# Patient Record
Sex: Female | Born: 1960 | Race: White | Hispanic: No | Marital: Married | Smoking: Former smoker
Health system: Southern US, Community
[De-identification: ages and names within clinical notes are randomized; demographics above are authoritative.]

## PROBLEM LIST (undated history)

## (undated) DIAGNOSIS — M1990 Unspecified osteoarthritis, unspecified site: Secondary | ICD-10-CM

## (undated) DIAGNOSIS — Z889 Allergy status to unspecified drugs, medicaments and biological substances status: Secondary | ICD-10-CM

## (undated) DIAGNOSIS — K219 Gastro-esophageal reflux disease without esophagitis: Secondary | ICD-10-CM

## (undated) DIAGNOSIS — M5136 Other intervertebral disc degeneration, lumbar region: Secondary | ICD-10-CM

## (undated) DIAGNOSIS — M51369 Other intervertebral disc degeneration, lumbar region without mention of lumbar back pain or lower extremity pain: Secondary | ICD-10-CM

## (undated) DIAGNOSIS — T7840XA Allergy, unspecified, initial encounter: Secondary | ICD-10-CM

## (undated) DIAGNOSIS — J45909 Unspecified asthma, uncomplicated: Secondary | ICD-10-CM

## (undated) DIAGNOSIS — E119 Type 2 diabetes mellitus without complications: Secondary | ICD-10-CM

## (undated) DIAGNOSIS — IMO0002 Reserved for concepts with insufficient information to code with codable children: Secondary | ICD-10-CM

## (undated) DIAGNOSIS — K275 Chronic or unspecified peptic ulcer, site unspecified, with perforation: Secondary | ICD-10-CM

## (undated) HISTORY — DX: Chronic or unspecified peptic ulcer, site unspecified, with perforation: K27.5

## (undated) HISTORY — PX: BUNIONECTOMY: SHX129

## (undated) HISTORY — DX: Type 2 diabetes mellitus without complications: E11.9

## (undated) HISTORY — PX: OTHER SURGICAL HISTORY: SHX169

## (undated) HISTORY — DX: Unspecified asthma, uncomplicated: J45.909

## (undated) HISTORY — PX: COLONOSCOPY: SHX174

## (undated) HISTORY — DX: Allergy, unspecified, initial encounter: T78.40XA

## (undated) HISTORY — DX: Gastro-esophageal reflux disease without esophagitis: K21.9

---

## 1995-11-11 HISTORY — PX: REPAIR OF PERFORATED ULCER: SHX6065

## 2014-05-11 ENCOUNTER — Emergency Department (HOSPITAL_COMMUNITY): Payer: BC Managed Care – PPO

## 2014-05-11 ENCOUNTER — Encounter (HOSPITAL_COMMUNITY): Payer: Self-pay | Admitting: Emergency Medicine

## 2014-05-11 ENCOUNTER — Emergency Department (HOSPITAL_COMMUNITY)
Admission: EM | Admit: 2014-05-11 | Discharge: 2014-05-11 | Disposition: A | Discharge status: Home / Self Care | Payer: BC Managed Care – PPO | Attending: Emergency Medicine | Admitting: Emergency Medicine

## 2014-05-11 DIAGNOSIS — K279 Peptic ulcer, site unspecified, unspecified as acute or chronic, without hemorrhage or perforation: Secondary | ICD-10-CM

## 2014-05-11 HISTORY — DX: Reserved for concepts with insufficient information to code with codable children: IMO0002

## 2014-05-11 LAB — CBC WITH DIFFERENTIAL/PLATELET
Basophils Absolute: 0 10*3/uL (ref 0.0–0.1)
Basophils Relative: 0 % (ref 0–1)
Eosinophils Absolute: 0.2 10*3/uL (ref 0.0–0.7)
Eosinophils Relative: 2 % (ref 0–5)
HCT: 42.3 % (ref 36.0–46.0)
Hemoglobin: 15 g/dL (ref 12.0–15.0)
Lymphocytes Relative: 38 % (ref 12–46)
Lymphs Abs: 4.7 10*3/uL — ABNORMAL HIGH (ref 0.7–4.0)
MCH: 29.8 pg (ref 26.0–34.0)
MCHC: 35.5 g/dL (ref 30.0–36.0)
MCV: 84.1 fL (ref 78.0–100.0)
Monocytes Absolute: 1 10*3/uL (ref 0.1–1.0)
Monocytes Relative: 8 % (ref 3–12)
Neutro Abs: 6.5 10*3/uL (ref 1.7–7.7)
Neutrophils Relative %: 52 % (ref 43–77)
Platelets: 330 10*3/uL (ref 150–400)
RBC: 5.03 MIL/uL (ref 3.87–5.11)
RDW: 12.3 % (ref 11.5–15.5)
WBC: 12.4 10*3/uL — ABNORMAL HIGH (ref 4.0–10.5)

## 2014-05-11 LAB — URINALYSIS, ROUTINE W REFLEX MICROSCOPIC
Glucose, UA: NEGATIVE mg/dL
Hgb urine dipstick: NEGATIVE
Ketones, ur: NEGATIVE mg/dL
Nitrite: NEGATIVE
Protein, ur: 30 mg/dL — AB
Specific Gravity, Urine: 1.027 (ref 1.005–1.030)
Urobilinogen, UA: 0.2 mg/dL (ref 0.0–1.0)
pH: 5.5 (ref 5.0–8.0)

## 2014-05-11 LAB — URINE MICROSCOPIC-ADD ON

## 2014-05-11 LAB — I-STAT TROPONIN, ED: Troponin i, poc: 0 ng/mL (ref 0.00–0.08)

## 2014-05-11 LAB — COMPREHENSIVE METABOLIC PANEL
ALT: 16 U/L (ref 0–35)
AST: 26 U/L (ref 0–37)
Albumin: 4.3 g/dL (ref 3.5–5.2)
Alkaline Phosphatase: 85 U/L (ref 39–117)
Anion gap: 16 — ABNORMAL HIGH (ref 5–15)
BUN: 9 mg/dL (ref 6–23)
CO2: 23 mEq/L (ref 19–32)
Calcium: 9.6 mg/dL (ref 8.4–10.5)
Chloride: 100 mEq/L (ref 96–112)
Creatinine, Ser: 0.83 mg/dL (ref 0.50–1.10)
GFR calc Af Amer: >90 mL/min (ref >90)
GFR calc non Af Amer: 79 mL/min — ABNORMAL LOW (ref >90)
Glucose, Bld: 121 mg/dL — ABNORMAL HIGH (ref 70–99)
Potassium: 3.6 mEq/L — ABNORMAL LOW (ref 3.7–5.3)
Sodium: 139 mEq/L (ref 137–147)
Total Bilirubin: 1.2 mg/dL (ref 0.3–1.2)
Total Protein: 8 g/dL (ref 6.0–8.3)

## 2014-05-11 LAB — LIPASE, BLOOD: Lipase: 14 U/L (ref 11–59)

## 2014-05-11 MED ORDER — ONDANSETRON HCL 4 MG/2ML IJ SOLN
4.0000 mg | Freq: Once | INTRAMUSCULAR | Status: AC
  Administered 2014-05-11: 4 mg via INTRAVENOUS
  Filled 2014-05-11: qty 2

## 2014-05-11 MED ORDER — OMEPRAZOLE 20 MG PO CPDR: 20.0000 mg | DELAYED_RELEASE_CAPSULE | Freq: Two times a day (BID) | ORAL | Status: DC

## 2014-05-11 MED ORDER — GI COCKTAIL ~~LOC~~
30.0000 mL | Freq: Once | ORAL | Status: AC
  Administered 2014-05-11: 30 mL via ORAL
  Filled 2014-05-11: qty 30

## 2014-05-11 MED ORDER — FAMOTIDINE IN NACL 20-0.9 MG/50ML-% IV SOLN
20.0000 mg | Freq: Once | INTRAVENOUS | Status: AC
  Administered 2014-05-11: 20 mg via INTRAVENOUS
  Filled 2014-05-11: qty 50

## 2014-05-11 MED ORDER — MORPHINE SULFATE 4 MG/ML IJ SOLN
4.0000 mg | Freq: Once | INTRAMUSCULAR | Status: AC
  Administered 2014-05-11: 4 mg via INTRAVENOUS
  Filled 2014-05-11: qty 1

## 2014-05-11 MED ORDER — SODIUM CHLORIDE 0.9 % IV BOLUS (SEPSIS)
1000.0000 mL | INTRAVENOUS | Status: AC
  Administered 2014-05-11: 1000 mL via INTRAVENOUS

## 2014-05-11 MED ORDER — IOHEXOL 300 MG/ML  SOLN
100.0000 mL | Freq: Once | INTRAMUSCULAR | Status: AC | PRN
  Administered 2014-05-11: 100 mL via INTRAVENOUS

## 2014-05-11 MED ORDER — IOHEXOL 300 MG/ML  SOLN
50.0000 mL | Freq: Once | INTRAMUSCULAR | Status: AC | PRN
  Administered 2014-05-11: 50 mL via ORAL

## 2014-05-11 NOTE — ED Notes (Signed)
PA-C Browning at bedside

## 2014-05-11 NOTE — ED Notes (Signed)
Pt aware urine sample is needed 

## 2014-05-11 NOTE — ED Notes (Signed)
Patient is alert and oriented x3.  She was given DC instructions and follow up visit instructions.  Patient gave verbal understanding. She was DC ambulatory under her own power to home.  V/S stable.  He was not showing any signs of distress on DC 

## 2014-05-11 NOTE — Discharge Instructions (Signed)
Peptic Ulcer  A peptic ulcer is a sore in the lining of in your esophagus (esophageal ulcer), stomach (gastric ulcer), or in the first part of your small intestine (duodenal ulcer). The ulcer causes erosion into the deeper tissue.  CAUSES   Normally, the lining of the stomach and the small intestine protects itself from the acid that digests food. The protective lining can be damaged by:  · An infection caused by a bacterium called Helicobacter pylori (H. pylori).  · Regular use of nonsteroidal anti-inflammatory drugs (NSAIDs), such as ibuprofen or aspirin.  · Smoking tobacco.  Other risk factors include being older than 50, drinking alcohol excessively, and having a family history of ulcer disease.   SYMPTOMS   · Burning pain or gnawing in the area between the chest and the belly button.  · Heartburn.  · Nausea and vomiting.  · Bloating.  The pain can be worse on an empty stomach and at night. If the ulcer results in bleeding, it can cause:  · Black, tarry stools.  · Vomiting of bright red blood.  · Vomiting of coffee ground looking materials.  DIAGNOSIS   A diagnosis is usually made based upon your history and an exam. Other tests and procedures may be performed to find the cause of the ulcer. Finding a cause will help determine the best treatment. Tests and procedures may include:  · Blood tests, stool tests, or breath tests to check for the bacterium H. pylori.  · An upper gastrointestinal (GI) series of the esophagus, stomach, and small intestine.  · An endoscopy to examine the esophagus, stomach, and small intestine.  · A biopsy.  TREATMENT   Treatment may include:  · Eliminating the cause of the ulcer, such as smoking, NSAIDs, or alcohol.  · Medicines to reduce the amount of acid in your digestive tract.  · Antibiotic medicines if the ulcer is caused by the H. pylori bacterium.  · An upper endoscopy to treat a bleeding ulcer.  · Surgery if the bleeding is severe or if the ulcer created a hole somewhere in the  digestive system.  HOME CARE INSTRUCTIONS   · Avoid tobacco, alcohol, and caffeine. Smoking can increase the acid in the stomach, and continued smoking will impair the healing of ulcers.  · Avoid foods and drinks that seem to cause discomfort or aggravate your ulcer.  · Only take medicines as directed by your caregiver. Do not substitute over-the-counter medicines for prescription medicines without talking to your caregiver.  · Keep any follow-up appointments and tests as directed.  SEEK MEDICAL CARE IF:   · Your do not improve within 7 days of starting treatment.  · You have ongoing indigestion or heartburn.  SEEK IMMEDIATE MEDICAL CARE IF:   · You have sudden, sharp, or persistent abdominal pain.  · You have bloody or dark black, tarry stools.  · You vomit blood or vomit that looks like coffee grounds.  · You become light headed, weak, or feel faint.  · You become sweaty or clammy.  MAKE SURE YOU:   · Understand these instructions.  · Will watch your condition.  · Will get help right away if you are not doing well or get worse.  Document Released: 10/24/2000 Document Revised: 07/21/2012 Document Reviewed: 05/26/2012  Exit[employer]® Patient Information ©2015 [employer]. This information is not intended to replace advice given to you by your health care provider. Make sure you discuss any questions you have with your health care provider.

## 2014-05-11 NOTE — ED Provider Notes (Signed)
CSN: [id number]     Arrival date & time 05/11/14  1637 History   First MD Initiated Contact with Patient 05/11/14 1658     Chief Complaint  Patient presents with  . Chest Pain  . Abdominal Pain     (Consider location/radiation/quality/duration/timing/severity/associated sxs/prior Treatment) HPI Comments: Patient presents to the emergency department with chief complaint of abdominal pain times one week. She states the pain is worsened after eating. She reports having persistent vomiting anytime she tries to eat something. She states that she has had a very small amount of watery diarrhea, but has not been able to pass gas. She has had emergent surgery on her abdomen in the past for GI bleed/ulcer. Additionally, she complains of intermittent chest pain which she associates with eating. She states that her pain as 5/10. It radiates to the left side. She denies fevers, chills, or new dysuria. She is postmenopausal.  The history is provided by the patient. No language interpreter was used.    Past Medical History  Diagnosis Date  . Ulcer     perforated   Past Surgical History  Procedure Laterality Date  . Bunionectomy    . Repair of perforated ulcer     No family history on file. History  Substance Use Topics  . Smoking status: Never Smoker   . Smokeless tobacco: Not on file  . Alcohol Use: No   OB History   Grav Para Term Preterm Abortions TAB SAB Ect Mult Living                 Review of Systems  All other systems reviewed and are negative.     Allergies  Review of patient's allergies indicates not on file.  Home Medications   Prior to Admission medications   Not on File   BP 145/75  Pulse 88  Temp(Src) 98.6 F (37 C) (Oral)  Resp 16  SpO2 97% Physical Exam  Nursing note and vitals reviewed. Constitutional: She is oriented to person, place, and time. She appears well-developed and well-nourished.  HENT:  Head: Normocephalic and atraumatic.  Eyes:  Conjunctivae and EOM are normal. Pupils are equal, round, and reactive to light.  Neck: Normal range of motion. Neck supple.  Cardiovascular: Normal rate and regular rhythm.  Exam reveals no gallop and no friction rub.   No murmur heard. Pulmonary/Chest: Effort normal and breath sounds normal. No respiratory distress. She has no wheezes. She has no rales. She exhibits no tenderness.  Abdominal: Soft. Bowel sounds are normal. She exhibits no distension and no mass. There is tenderness. There is no rebound and no guarding.  Tenderness to palpation in upper abdomen, more on the left side, no focal lower abdominal tenderness  Musculoskeletal: Normal range of motion. She exhibits no edema and no tenderness.  Neurological: She is alert and oriented to person, place, and time.  Skin: Skin is warm and dry.  Psychiatric: She has a normal mood and affect. Her behavior is normal. Judgment and thought content normal.    ED Course  Procedures (including critical care time) Results for orders placed during the hospital encounter of 05/11/14  CBC WITH DIFFERENTIAL      Result Value Ref Range   WBC 12.4 (*) 4.0 - 10.5 K/uL   RBC 5.03  3.87 - 5.11 MIL/uL   Hemoglobin 15.0  12.0 - 15.0 g/dL   HCT 42.3  36.0 - 46.0 %   MCV 84.1  78.0 - 100.0 fL   MCH 29.8  26.0 - 34.0 pg   MCHC 35.5  30.0 - 36.0 g/dL   RDW 12.3  11.5 - 15.5 %   Platelets 330  150 - 400 K/uL   Neutrophils Relative % 52  43 - 77 %   Neutro Abs 6.5  1.7 - 7.7 K/uL   Lymphocytes Relative 38  12 - 46 %   Lymphs Abs 4.7 (*) 0.7 - 4.0 K/uL   Monocytes Relative 8  3 - 12 %   Monocytes Absolute 1.0  0.1 - 1.0 K/uL   Eosinophils Relative 2  0 - 5 %   Eosinophils Absolute 0.2  0.0 - 0.7 K/uL   Basophils Relative 0  0 - 1 %   Basophils Absolute 0.0  0.0 - 0.1 K/uL  COMPREHENSIVE METABOLIC PANEL      Result Value Ref Range   Sodium 139  137 - 147 mEq/L   Potassium 3.6 (*) 3.7 - 5.3 mEq/L   Chloride 100  96 - 112 mEq/L   CO2 23  19 - 32  mEq/L   Glucose, Bld 121 (*) 70 - 99 mg/dL   BUN 9  6 - 23 mg/dL   Creatinine, Ser 0.83  0.50 - 1.10 mg/dL   Calcium 9.6  8.4 - 10.5 mg/dL   Total Protein 8.0  6.0 - 8.3 g/dL   Albumin 4.3  3.5 - 5.2 g/dL   AST 26  0 - 37 U/L   ALT 16  0 - 35 U/L   Alkaline Phosphatase 85  39 - 117 U/L   Total Bilirubin 1.2  0.3 - 1.2 mg/dL   GFR calc non Af Amer 79 (*) >90 mL/min   GFR calc Af Amer >90  >90 mL/min   Anion gap 16 (*) 5 - 15  LIPASE, BLOOD      Result Value Ref Range   Lipase 14  11 - 59 U/L  URINALYSIS, ROUTINE W REFLEX MICROSCOPIC      Result Value Ref Range   Color, Urine AMBER (*) YELLOW   APPearance CLOUDY (*) CLEAR   Specific Gravity, Urine 1.027  1.005 - 1.030   pH 5.5  5.0 - 8.0   Glucose, UA NEGATIVE  NEGATIVE mg/dL   Hgb urine dipstick NEGATIVE  NEGATIVE   Bilirubin Urine SMALL (*) NEGATIVE   Ketones, ur NEGATIVE  NEGATIVE mg/dL   Protein, ur 30 (*) NEGATIVE mg/dL   Urobilinogen, UA 0.2  0.0 - 1.0 mg/dL   Nitrite NEGATIVE  NEGATIVE   Leukocytes, UA SMALL (*) NEGATIVE  URINE MICROSCOPIC-ADD ON      Result Value Ref Range   Squamous Epithelial / LPF FEW (*) RARE   WBC, UA 3-6  <3 WBC/hpf   RBC / HPF 0-2  <3 RBC/hpf   Bacteria, UA FEW (*) RARE   Casts HYALINE CASTS (*) NEGATIVE   Urine-Other MUCOUS PRESENT    I-STAT TROPOININ, ED      Result Value Ref Range   Troponin i, poc 0.00  0.00 - 0.08 ng/mL   Comment 3            Ct Abdomen Pelvis W Contrast  05/11/2014   CLINICAL DATA:  Postprandial abdominal pain with vomiting and diarrhea. Symptoms for approximately 1 week.  EXAM: CT ABDOMEN AND PELVIS WITH CONTRAST  TECHNIQUE: Multidetector CT imaging of the abdomen and pelvis was performed using the standard protocol following bolus administration of intravenous contrast.  CONTRAST:  13mL OMNIPAQUE IOHEXOL 300 MG/ML SOLN, 56mL OMNIPAQUE  IOHEXOL 300 MG/ML SOLN  COMPARISON:  None.  FINDINGS: The lung bases are clear. There is no significant pleural or pericardial  effusion.  The liver and biliary system appear normal. There is a small calcified gallstone. No gallbladder wall thickening or surrounding inflammatory change is seen. The spleen, adrenal glands and kidneys appear normal.  The distal stomach and proximal duodenum demonstrate mild wall thickening and surrounding inflammatory change. There are adjacent prominent lymph nodes on axial images 30-32, likely reactive. There also mildly prominent lymph nodes within the porta hepatis and ileocolonic mesentery. The remainder of the small bowel, appendix and colon appear normal. No extraluminal air or fluid collection is identified. There is no evidence of bowel obstruction.  There is minimal aortic atherosclerosis without aneurysm. The uterus, ovaries and bladder appear normal.  There are no worrisome osseous findings.  IMPRESSION: 1. Distal gastric and proximal duodenal wall thickening and surrounding inflammatory change suspicious for peptic ulcer disease or duodenitis. If symptoms do not respond to appropriate conservative therapy, endoscopic correlation would be warranted. 2. Prominent lymph nodes in the upper abdomen, likely reactive. 3. Cholelithiasis without evidence of cholecystitis or biliary dilatation.   Electronically Signed   By: Camie Patience M.D.   On: 05/11/2014 19:30   Dg Chest Portable 1 View  05/11/2014   CLINICAL DATA:  Left-sided chest pain  EXAM: PORTABLE CHEST - 1 VIEW  COMPARISON:  None.  FINDINGS: Lungs are clear.  No pleural effusion or pneumothorax.  The heart is normal in size.  IMPRESSION: No evidence of acute cardiopulmonary disease.   Electronically Signed   By: Julian Hy M.D.   On: 05/11/2014 18:00     Imaging Review No results found.   EKG Interpretation   Date/Time:  Thursday May 11 2014 16:45:13 EDT Ventricular Rate:  89 PR Interval:  149 QRS Duration: 88 QT Interval:  360 QTC Calculation: 438 R Axis:   -6 Text Interpretation:  Sinus rhythm Baseline wander  Confirmed by Alvino Chapel   MD, Ovid Curd [phone]) on 05/11/2014 4:52:36 PM      MDM   Final diagnoses:  PUD (peptic ulcer disease)    Patient with abdominal pain, nausea, vomiting, and diarrhea times one week. She states that she is concerned about obstruction. She does have risk factors including prior abdominal surgery. Will check CT, check labs, treat pain, and will reevaluate.  8:35 PM Patient is feeling improved. Reassuring labs.  Normal trop and ekg.  HEART score is 1.  Low suspicion for PE. CT remarkable for PUD vs duodenitis.  Will treat with omeprazole and recommend GI follow-up.      Montine Circle, PA-C 05/11/14 2038

## 2014-05-11 NOTE — ED Notes (Signed)
Pt c/o upper abd pain then 4-6 hours after eating (even small amounts) pt vomits bile like liquid and then next day has diarrhea that looks like bile as well. Pt states after the vomiting she has left chest pain.  Pt states this has been going on for about a week now.

## 2014-05-11 NOTE — ED Notes (Addendum)
Pt reports abdominal and left sided chest pain 3-5 post injection of meal. Pt denies CP at present time.

## 2014-05-11 NOTE — ED Notes (Signed)
Patient to be DC home after IVPB Pepcid completed, see MAR

## 2014-05-11 NOTE — Progress Notes (Signed)
  CARE MANAGEMENT ED NOTE 05/11/2014  Patient:  Christina [name], Christina [name]   Account Number:  [id number]  Date Initiated:  05/11/2014  Documentation initiated by:  Livia Snellen  Subjective/Objective Assessment:   Patient presents to Ed with abdominal and left sided chest pain     Subjective/Objective Assessment Detail:     Action/Plan:   Action/Plan Detail:   Anticipated DC Date:       Status Recommendation to Physician:   Result of Recommendation:    Other ED [place]  Other  PCP issues    Choice offered to / List presented to:            Status of service:  Completed, signed off  ED Comments:   ED Comments Detail:  EDCM spoke to patient at bedside.  As per patient, her pcp is Waunita Schooner NP at [facility] in [place].  System updated.

## 2014-05-12 NOTE — ED Provider Notes (Signed)
Medical screening examination/treatment/procedure(s) were performed by non-physician practitioner and as supervising physician I was immediately available for consultation/collaboration.   EKG Interpretation   Date/Time:  Thursday May 11 2014 16:45:13 EDT Ventricular Rate:  89 PR Interval:  149 QRS Duration: 88 QT Interval:  360 QTC Calculation: 438 R Axis:   -6 Text Interpretation:  Sinus rhythm Baseline wander Confirmed by Alvino Chapel   MD, Naksh Radi [phone]) on 05/11/2014 4:52:36 PM       Jasper Riling. Alvino Chapel, MD 05/12/14 [phone]

## 2015-12-27 ENCOUNTER — Encounter: Payer: Self-pay | Admitting: Family Medicine

## 2015-12-27 DIAGNOSIS — T7840XA Allergy, unspecified, initial encounter: Secondary | ICD-10-CM | POA: Insufficient documentation

## 2015-12-27 DIAGNOSIS — J45909 Unspecified asthma, uncomplicated: Secondary | ICD-10-CM | POA: Insufficient documentation

## 2015-12-27 DIAGNOSIS — K275 Chronic or unspecified peptic ulcer, site unspecified, with perforation: Secondary | ICD-10-CM | POA: Insufficient documentation

## 2015-12-28 ENCOUNTER — Ambulatory Visit (INDEPENDENT_AMBULATORY_CARE_PROVIDER_SITE_OTHER): Payer: BLUE CROSS/BLUE SHIELD | Admitting: Family Medicine

## 2015-12-28 ENCOUNTER — Encounter: Payer: Self-pay | Admitting: Family Medicine

## 2015-12-28 VITALS — BP 132/88 | HR 82 | Temp 97.7°F | Resp 18 | Ht 65.5 in | Wt 233.0 lb

## 2015-12-28 DIAGNOSIS — Z7689 Persons encountering health services in other specified circumstances: Secondary | ICD-10-CM

## 2015-12-28 DIAGNOSIS — R739 Hyperglycemia, unspecified: Secondary | ICD-10-CM | POA: Diagnosis not present

## 2015-12-28 DIAGNOSIS — Z7189 Other specified counseling: Secondary | ICD-10-CM | POA: Diagnosis not present

## 2015-12-28 DIAGNOSIS — Z0000 Encounter for general adult medical examination without abnormal findings: Secondary | ICD-10-CM

## 2015-12-28 MED ORDER — METFORMIN HCL 500 MG PO TABS: 1000.0000 mg | ORAL_TABLET | Freq: Two times a day (BID) | ORAL | Status: DC

## 2015-12-28 NOTE — Progress Notes (Signed)
Subjective:    Patient ID: Christina [name], female    DOB: January 17, 1961, 55 y.o.   MRN: [id number]  HPI Patient is here today to establish care. She was also scheduled for a physical exam. However in the interval time she is become very concerned because it appears she is a diabetic. She recently had a colonoscopy at Dr. Lorie Apley office. Her random blood sugar was found to be 290. They repeated another random blood sugar and found to be 281. Both of which would be consistent with diabetes. The patient has been checking fasting blood sugars every morning and finding it ranging between 2020-221. Therefore I anticipateher hemoglobin A1c will be between 9.5 and 10.5. She is here today with numerous questions regarding this and how to manage her diabetes. Colonoscopy was normal. She is also recently had an eye exam and a mammogram which were normal (per her report). Past Medical History  Diagnosis Date  . Ulcer     perforated  . Allergy   . Asthma   . Perforated ulcer ([place])     due to ibuprofen  . GERD (gastroesophageal reflux disease)    Past Surgical History  Procedure Laterality Date  . Bunionectomy    . Repair of perforated ulcer     Current Outpatient Prescriptions on File Prior to Visit  Medication Sig Dispense Refill  . Beclomethasone Dipropionate (QNASL) 80 MCG/ACT AERS Place 1 spray into the nose daily.    . DiphenhydrAMINE HCl (BENADRYL PO) Take by mouth. 1 or 2 daily as needed    . omeprazole (PRILOSEC) 20 MG capsule Take 1 capsule (20 mg total) by mouth 2 (two) times daily before a meal. 60 capsule 0   No current facility-administered medications on file prior to visit.   Allergies  Allergen Reactions  . Influenza Vaccines Other (See Comments)    Seizures and shortness of breath  . Egg White [Albumen, Egg]     Sneezing   . Ibuprofen    Social History   Social History  . Marital Status: Married    Spouse Name: N/A  . Number of Children: N/A  . Years of Education: N/A     Occupational History  . Not on file.   Social History Main Topics  . Smoking status: Never Smoker   . Smokeless tobacco: Never Used  . Alcohol Use: No  . Drug Use: No  . Sexual Activity: Yes   Other Topics Concern  . Not on file   Social History Narrative   Family History  Problem Relation Age of Onset  . Kidney disease Father   . Diabetes Father   . Asthma Sister   . Diabetes Sister   . Diabetes Brother       Review of Systems  All other systems reviewed and are negative.      Objective:   Physical Exam  Constitutional: She appears well-developed and well-nourished. No distress.  Cardiovascular: Normal rate, regular rhythm, normal heart sounds and intact distal pulses.   No murmur heard. Pulmonary/Chest: Effort normal and breath sounds normal. No respiratory distress. She has no wheezes. She has no rales.  Abdominal: Soft. Bowel sounds are normal. She exhibits no distension. There is no tenderness. There is no rebound and no guarding.  Skin: She is not diaphoretic.  Vitals reviewed.         Assessment & Plan:  Adult general medical exam - Plan: CBC with Differential/Platelet, COMPLETE METABOLIC PANEL WITH GFR, Lipid panel  Establishing care with  new doctor, encounter for - Plan: VITAMIN D 25 Hydroxy (Vit-D Deficiency, Fractures)  Hyperglycemia - Plan: Hemoglobin A1c, Microalbumin, urine  I was unable to perform a complete physical exam as I spent more than 45 minutes answering the patient's questions. She will start metformin and gradually increased to 1000 mg by mouth twice a day. Also discussed at length a diabetic low carb diet trying to reduce her carb intake to less than 45 g per meal. Also recommended 30 minutes a day of aerobic exercise. I will check a CBC, CMP, fasting lipid panel, urine microalbumin, and a vitamin D level. I will have the patient check fasting blood sugars and two-hour postprandial sugars and then recheck here in one month. If  hemoglobin A1c is significantly elevated, we may also need to supplement the metformin with jardiance.

## 2015-12-29 LAB — COMPLETE METABOLIC PANEL WITH GFR
ALT: 20 U/L (ref 6–29)
AST: 21 U/L (ref 10–35)
Albumin: 4.5 g/dL (ref 3.6–5.1)
Alkaline Phosphatase: 80 U/L (ref 33–130)
BUN: 10 mg/dL (ref 7–25)
CO2: 24 mmol/L (ref 20–31)
Calcium: 9.3 mg/dL (ref 8.6–10.4)
Chloride: 105 mmol/L (ref 98–110)
Creat: 0.85 mg/dL (ref 0.50–1.05)
GFR, Est African American: 89 mL/min (ref >=60)
GFR, Est Non African American: 77 mL/min (ref >=60)
Glucose, Bld: 139 mg/dL — ABNORMAL HIGH (ref 70–99)
Potassium: 3.9 mmol/L (ref 3.5–5.3)
Sodium: 141 mmol/L (ref 135–146)
Total Bilirubin: 1 mg/dL (ref 0.2–1.2)
Total Protein: 7.1 g/dL (ref 6.1–8.1)

## 2015-12-29 LAB — CBC WITH DIFFERENTIAL/PLATELET
Basophils Absolute: 0 10*3/uL (ref 0.0–0.1)
Basophils Relative: 0 % (ref 0–1)
Eosinophils Absolute: 0.3 10*3/uL (ref 0.0–0.7)
Eosinophils Relative: 3 % (ref 0–5)
HCT: 42.1 % (ref 36.0–46.0)
Hemoglobin: 14.3 g/dL (ref 12.0–15.0)
Lymphocytes Relative: 45 % (ref 12–46)
Lymphs Abs: 4.1 10*3/uL — ABNORMAL HIGH (ref 0.7–4.0)
MCH: 29.5 pg (ref 26.0–34.0)
MCHC: 34 g/dL (ref 30.0–36.0)
MCV: 87 fL (ref 78.0–100.0)
MPV: 10.5 fL (ref 8.6–12.4)
Monocytes Absolute: 0.6 10*3/uL (ref 0.1–1.0)
Monocytes Relative: 7 % (ref 3–12)
Neutro Abs: 4.1 10*3/uL (ref 1.7–7.7)
Neutrophils Relative %: 45 % (ref 43–77)
Platelets: 282 10*3/uL (ref 150–400)
RBC: 4.84 MIL/uL (ref 3.87–5.11)
RDW: 13.5 % (ref 11.5–15.5)
WBC: 9.1 10*3/uL (ref 4.0–10.5)

## 2015-12-29 LAB — LIPID PANEL
Cholesterol: 128 mg/dL (ref 125–200)
HDL: 40 mg/dL — ABNORMAL LOW (ref >=46)
LDL Cholesterol: 58 mg/dL (ref <130)
Total CHOL/HDL Ratio: 3.2 Ratio (ref <=5.0)
Triglycerides: 149 mg/dL (ref <150)
VLDL: 30 mg/dL (ref <30)

## 2015-12-29 LAB — VITAMIN D 25 HYDROXY (VIT D DEFICIENCY, FRACTURES): Vit D, 25-Hydroxy: 13 ng/mL — ABNORMAL LOW (ref 30–100)

## 2015-12-29 LAB — MICROALBUMIN, URINE: Microalb, Ur: 4.2 mg/dL

## 2015-12-29 LAB — HEMOGLOBIN A1C
Hgb A1c MFr Bld: 9.2 % — ABNORMAL HIGH (ref <5.7)
Mean Plasma Glucose: 217 mg/dL — ABNORMAL HIGH (ref <117)

## 2016-02-01 ENCOUNTER — Encounter: Payer: Self-pay | Admitting: Family Medicine

## 2016-02-01 ENCOUNTER — Ambulatory Visit (INDEPENDENT_AMBULATORY_CARE_PROVIDER_SITE_OTHER): Payer: BLUE CROSS/BLUE SHIELD | Admitting: Family Medicine

## 2016-02-01 VITALS — BP 136/78 | HR 86 | Temp 98.0°F | Resp 18 | Ht 65.5 in | Wt 228.0 lb

## 2016-02-01 DIAGNOSIS — E1100 Type 2 diabetes mellitus with hyperosmolarity without nonketotic hyperglycemic-hyperosmolar coma (NKHHC): Secondary | ICD-10-CM | POA: Diagnosis not present

## 2016-02-01 DIAGNOSIS — R739 Hyperglycemia, unspecified: Secondary | ICD-10-CM | POA: Diagnosis not present

## 2016-02-01 NOTE — Progress Notes (Signed)
Subjective:    Patient ID: Christina [name], female    DOB: [birth year], 55 y.o.   MRN: [id number]  HPI 2/17 Patient is here today to establish care. She was also scheduled for a physical exam. However in the interval time she is become very concerned because it appears she is a diabetic. She recently had a colonoscopy at Dr. Lorie Apley office. Her random blood sugar was found to be 290. They repeated another random blood sugar and found to be 281. Both of which would be consistent with diabetes. The patient has been checking fasting blood sugars every morning and finding it ranging between 2020-221. Therefore I anticipateher hemoglobin A1c will be between 9.5 and 10.5. She is here today with numerous questions regarding this and how to manage her diabetes. Colonoscopy was normal. She is also recently had an eye exam and a mammogram which were normal (per her report).  At that time, my plan was: I was unable to perform a complete physical exam as I spent more than 45 minutes answering the patient's questions. She will start metformin and gradually increased to 1000 mg by mouth twice a day. Also discussed at length a diabetic low carb diet trying to reduce her carb intake to less than 45 g per meal. Also recommended 30 minutes a day of aerobic exercise. I will check a CBC, CMP, fasting lipid panel, urine microalbumin, and a vitamin D level. I will have the patient check fasting blood sugars and two-hour postprandial sugars and then recheck here in one month. If hemoglobin A1c is significantly elevated, we may also need to supplement the metformin with jardiance.  02/01/16 Patient is currently on metformin 1000 mg by mouth twice a day. Her fasting blood sugars are between 130 and 145. Her two-hour postprandial sugars are between 120 and 150. I am presently surprised. Her sugars are very consistent. She seems to be doing very well and denies any side effects on the metformin. Past Medical History  Diagnosis  Date  . Ulcer     perforated  . Allergy   . Asthma   . Perforated ulcer ([place])     due to ibuprofen  . GERD (gastroesophageal reflux disease)    Past Surgical History  Procedure Laterality Date  . Bunionectomy    . Repair of perforated ulcer     Current Outpatient Prescriptions on File Prior to Visit  Medication Sig Dispense Refill  . Beclomethasone Dipropionate (QNASL) 80 MCG/ACT AERS Place 1 spray into the nose daily.    . DiphenhydrAMINE HCl (BENADRYL PO) Take by mouth. 1 or 2 daily as needed    . omeprazole (PRILOSEC) 20 MG capsule Take 1 capsule (20 mg total) by mouth 2 (two) times daily before a meal. 60 capsule 0   No current facility-administered medications on file prior to visit.   Allergies  Allergen Reactions  . Influenza Vaccines Other (See Comments)    Seizures and shortness of breath  . Egg White [Albumen, Egg]     Sneezing   . Ibuprofen    Social History   Social History  . Marital Status: Married    Spouse Name: N/A  . Number of Children: N/A  . Years of Education: N/A   Occupational History  . Not on file.   Social History Main Topics  . Smoking status: Never Smoker   . Smokeless tobacco: Never Used  . Alcohol Use: No  . Drug Use: No  . Sexual Activity: Yes   Other Topics  Concern  . Not on file   Social History Narrative   Family History  Problem Relation Age of Onset  . Kidney disease Father   . Diabetes Father   . Asthma Sister   . Diabetes Sister   . Diabetes Brother       Review of Systems  All other systems reviewed and are negative.      Objective:   Physical Exam  Constitutional: She appears well-developed and well-nourished. No distress.  Cardiovascular: Normal rate, regular rhythm, normal heart sounds and intact distal pulses.   No murmur heard. Pulmonary/Chest: Effort normal and breath sounds normal. No respiratory distress. She has no wheezes. She has no rales.  Abdominal: Soft. Bowel sounds are normal. She  exhibits no distension. There is no tenderness. There is no rebound and no guarding.  Skin: She is not diaphoretic.  Vitals reviewed.         Assessment & Plan:  Hyperglycemia  Uncontrolled type 2 diabetes mellitus with hyperosmolarity without coma, without long-term current use of insulin (HCC) I recommended continuing metformin at the present time and rechecking hemoglobin A1c along with a fasting lipid panel in 3 months. At that time we will check a urine microalbumin and if elevated we'll add an ACE inhibitor. I recommended 10-15 pounds weight loss and 30 minutes of aerobic exercise 5 days a week. If hemoglobin A1c is not less than 6.5 at her next visit, I will add jardiance.

## 2016-02-11 ENCOUNTER — Telehealth: Payer: Self-pay | Admitting: Family Medicine

## 2016-02-11 NOTE — Telephone Encounter (Signed)
Patient forgot to ask when she was here last if you would please check her Vitamin B and vitamin D levels when she comes in for blood work in June.  CB# [phone]

## 2016-02-11 NOTE — Telephone Encounter (Signed)
ok 

## 2016-02-11 NOTE — Telephone Encounter (Signed)
OK to do and I will put in for future labs - vit d was just done 12/28/15 = abnormal and taking vit d 5000 qd

## 2016-02-13 ENCOUNTER — Other Ambulatory Visit: Payer: Self-pay | Admitting: Family Medicine

## 2016-02-13 DIAGNOSIS — E119 Type 2 diabetes mellitus without complications: Secondary | ICD-10-CM

## 2016-02-13 DIAGNOSIS — Z79899 Other long term (current) drug therapy: Secondary | ICD-10-CM

## 2016-02-13 DIAGNOSIS — R7989 Other specified abnormal findings of blood chemistry: Secondary | ICD-10-CM

## 2016-02-13 NOTE — Telephone Encounter (Signed)
Orders placed.

## 2016-04-22 ENCOUNTER — Other Ambulatory Visit: Payer: Self-pay | Admitting: Family Medicine

## 2016-04-22 MED ORDER — METFORMIN HCL 1000 MG PO TABS: 1000.0000 mg | ORAL_TABLET | Freq: Two times a day (BID) | ORAL | Status: DC

## 2016-04-22 NOTE — Telephone Encounter (Signed)
Medication refilled per protocol. 

## 2016-04-22 NOTE — Telephone Encounter (Signed)
Pt is requesting a refill of metformin. She is leaving to go out of town tomorrow.

## 2016-05-01 ENCOUNTER — Other Ambulatory Visit: Payer: Self-pay | Admitting: Gastroenterology

## 2016-05-01 DIAGNOSIS — R1013 Epigastric pain: Secondary | ICD-10-CM

## 2016-05-05 ENCOUNTER — Other Ambulatory Visit: Payer: BLUE CROSS/BLUE SHIELD

## 2016-05-06 ENCOUNTER — Ambulatory Visit: Payer: BLUE CROSS/BLUE SHIELD | Admitting: Family Medicine

## 2016-05-12 ENCOUNTER — Other Ambulatory Visit: Payer: BLUE CROSS/BLUE SHIELD

## 2016-05-12 ENCOUNTER — Ambulatory Visit: Payer: BLUE CROSS/BLUE SHIELD | Admitting: Family Medicine

## 2016-05-12 DIAGNOSIS — Z79899 Other long term (current) drug therapy: Secondary | ICD-10-CM

## 2016-05-12 DIAGNOSIS — E119 Type 2 diabetes mellitus without complications: Secondary | ICD-10-CM

## 2016-05-12 DIAGNOSIS — R7989 Other specified abnormal findings of blood chemistry: Secondary | ICD-10-CM

## 2016-05-12 LAB — HEMOGLOBIN A1C
Hgb A1c MFr Bld: 6.1 % — ABNORMAL HIGH (ref <5.7)
Mean Plasma Glucose: 128 mg/dL

## 2016-05-12 LAB — VITAMIN B12: Vitamin B-12: 459 pg/mL (ref 200–1100)

## 2016-05-12 LAB — COMPLETE METABOLIC PANEL WITH GFR
ALT: 22 U/L (ref 6–29)
AST: 20 U/L (ref 10–35)
Albumin: 4.5 g/dL (ref 3.6–5.1)
Alkaline Phosphatase: 53 U/L (ref 33–130)
BUN: 10 mg/dL (ref 7–25)
CO2: 25 mmol/L (ref 20–31)
Calcium: 9.2 mg/dL (ref 8.6–10.4)
Chloride: 106 mmol/L (ref 98–110)
Creat: 0.68 mg/dL (ref 0.50–1.05)
GFR, Est African American: >89 mL/min (ref >=60)
GFR, Est Non African American: >89 mL/min (ref >=60)
Glucose, Bld: 153 mg/dL — ABNORMAL HIGH (ref 70–99)
Potassium: 4.1 mmol/L (ref 3.5–5.3)
Sodium: 142 mmol/L (ref 135–146)
Total Bilirubin: 1.2 mg/dL (ref 0.2–1.2)
Total Protein: 6.9 g/dL (ref 6.1–8.1)

## 2016-05-13 LAB — VITAMIN D 25 HYDROXY (VIT D DEFICIENCY, FRACTURES): Vit D, 25-Hydroxy: 23 ng/mL — ABNORMAL LOW (ref 30–100)

## 2016-05-15 ENCOUNTER — Ambulatory Visit (INDEPENDENT_AMBULATORY_CARE_PROVIDER_SITE_OTHER): Payer: BLUE CROSS/BLUE SHIELD | Admitting: Family Medicine

## 2016-05-15 ENCOUNTER — Encounter: Payer: Self-pay | Admitting: Family Medicine

## 2016-05-15 VITALS — BP 130/76 | HR 78 | Temp 98.8°F | Resp 18 | Ht 65.5 in | Wt 226.0 lb

## 2016-05-15 DIAGNOSIS — E119 Type 2 diabetes mellitus without complications: Secondary | ICD-10-CM | POA: Diagnosis not present

## 2016-05-15 NOTE — Progress Notes (Signed)
Subjective:    Patient ID: Christina [name], female    DOB: 30-Jan-1961, 55 y.o.   MRN: [id number]  HPI 2/17 Patient is here today to establish care. She was also scheduled for a physical exam. However in the interval time she is become very concerned because it appears she is a diabetic. She recently had a colonoscopy at Dr. Lorie Apley office. Her random blood sugar was found to be 290. They repeated another random blood sugar and found to be 281. Both of which would be consistent with diabetes. The patient has been checking fasting blood sugars every morning and finding it ranging between 2020-221. Therefore I anticipateher hemoglobin A1c will be between 9.5 and 10.5. She is here today with numerous questions regarding this and how to manage her diabetes. Colonoscopy was normal. She is also recently had an eye exam and a mammogram which were normal (per her report).  At that time, my plan was: I was unable to perform a complete physical exam as I spent more than 45 minutes answering the patient's questions. She will start metformin and gradually increased to 1000 mg by mouth twice a day. Also discussed at length a diabetic low carb diet trying to reduce her carb intake to less than 45 g per meal. Also recommended 30 minutes a day of aerobic exercise. I will check a CBC, CMP, fasting lipid panel, urine microalbumin, and a vitamin D level. I will have the patient check fasting blood sugars and two-hour postprandial sugars and then recheck here in one month. If hemoglobin A1c is significantly elevated, we may also need to supplement the metformin with jardiance.  02/01/16 Patient is currently on metformin 1000 mg by mouth twice a day. Her fasting blood sugars are between 130 and 145. Her two-hour postprandial sugars are between 120 and 150. I am presently surprised. Her sugars are very consistent. She seems to be doing very well and denies any side effects on the metformin.  At that time, my plan was: I  recommended continuing metformin at the present time and rechecking hemoglobin A1c along with a fasting lipid panel in 3 months. At that time we will check a urine microalbumin and if elevated we'll add an ACE inhibitor. I recommended 10-15 pounds weight loss and 30 minutes of aerobic exercise 5 days a week. If hemoglobin A1c is not less than 6.5 at her next visit, I will add jardiance.  05/15/16 Here today for follow up.  Overall she is doing very well. She is tolerating metformin without difficulty. She denies any side effects from medication. Diabetic eye exam is up-to-date. She is not taking aspirin due to her history of a perforated ulcer. She denies any neuropathy. She denies any chest pain shortness of breath or dyspnea on exertion. She denies any hypoglycemia. Hemoglobin A1c has dropped dramatically. Unfortunately she is not exercising.  Most recent labs are listed below: Lab on 05/12/2016  Component Date Value Ref Range Status  . Hgb A1c MFr Bld 05/12/2016 6.1* <5.7 % Final   Comment:   For someone without known diabetes, a hemoglobin A1c value between 5.7% and 6.4% is consistent with prediabetes and should be confirmed with a follow-up test.   For someone with known diabetes, a value <7% indicates that their diabetes is well controlled. A1c targets should be individualized based on duration of diabetes, age, co-morbid conditions and other considerations.   This assay result is consistent with an increased risk of diabetes.   Currently, no consensus exists regarding  use of hemoglobin A1c for diagnosis of diabetes in children.     . Mean Plasma Glucose 05/12/2016 128   Final  . Vitamin B-12 05/12/2016 459  200 - 1100 pg/mL Final  . Vit D, 25-Hydroxy 05/12/2016 23* 30 - 100 ng/mL Final   Comment: Vitamin D Status           25-OH Vitamin D        Deficiency                <20 ng/mL        Insufficiency         20 - 29 ng/mL        Optimal             > or = 30 ng/mL   For 25-OH  Vitamin D testing on patients on D2-supplementation and patients for whom quantitation of D2 and D3 fractions is required, the QuestAssureD 25-OH VIT D, (D2,D3), LC/MS/MS is recommended: order code [id number] (patients > 2 yrs).   . Sodium 05/12/2016 142  135 - 146 mmol/L Final  . Potassium 05/12/2016 4.1  3.5 - 5.3 mmol/L Final  . Chloride 05/12/2016 106  98 - 110 mmol/L Final  . CO2 05/12/2016 25  20 - 31 mmol/L Final  . Glucose, Bld 05/12/2016 153* 70 - 99 mg/dL Final  . BUN 05/12/2016 10  7 - 25 mg/dL Final  . Creat 05/12/2016 0.68  0.50 - 1.05 mg/dL Final   Comment:   For patients > or = 56 years of age: The upper reference limit for Creatinine is approximately 13% higher for people identified as African-American.     . Total Bilirubin 05/12/2016 1.2  0.2 - 1.2 mg/dL Final  . Alkaline Phosphatase 05/12/2016 53  33 - 130 U/L Final  . AST 05/12/2016 20  10 - 35 U/L Final  . ALT 05/12/2016 22  6 - 29 U/L Final  . Total Protein 05/12/2016 6.9  6.1 - 8.1 g/dL Final  . Albumin 05/12/2016 4.5  3.6 - 5.1 g/dL Final  . Calcium 05/12/2016 9.2  8.6 - 10.4 mg/dL Final  . GFR, Est African American 05/12/2016 >89  >=60 mL/min Final  . GFR, Est Non African American 05/12/2016 >89  >=60 mL/min Final    Past Medical History  Diagnosis Date  . Ulcer     perforated  . Allergy   . Asthma   . Perforated ulcer ([place])     due to ibuprofen  . GERD (gastroesophageal reflux disease)    Past Surgical History  Procedure Laterality Date  . Bunionectomy    . Repair of perforated ulcer     Current Outpatient Prescriptions on File Prior to Visit  Medication Sig Dispense Refill  . Beclomethasone Dipropionate (QNASL) 80 MCG/ACT AERS Place 1 spray into the nose daily.    . DiphenhydrAMINE HCl (BENADRYL PO) Take by mouth. 1 or 2 daily as needed    . metFORMIN (GLUCOPHAGE) 1000 MG tablet Take 1 tablet (1,000 mg total) by mouth 2 (two) times daily with a meal. 60 tablet 2  . omeprazole (PRILOSEC) 20 MG  capsule Take 1 capsule (20 mg total) by mouth 2 (two) times daily before a meal. 60 capsule 0   No current facility-administered medications on file prior to visit.   Allergies  Allergen Reactions  . Influenza Vaccines Other (See Comments)    Seizures and shortness of breath  . Egg White [Albumen, Egg]     Sneezing   .  Ibuprofen    Social History   Social History  . Marital Status: Married    Spouse Name: N/A  . Number of Children: N/A  . Years of Education: N/A   Occupational History  . Not on file.   Social History Main Topics  . Smoking status: Never Smoker   . Smokeless tobacco: Never Used  . Alcohol Use: No  . Drug Use: No  . Sexual Activity: Yes   Other Topics Concern  . Not on file   Social History Narrative   Family History  Problem Relation Age of Onset  . Kidney disease Father   . Diabetes Father   . Asthma Sister   . Diabetes Sister   . Diabetes Brother       Review of Systems  All other systems reviewed and are negative.      Objective:   Physical Exam  Constitutional: She appears well-developed and well-nourished. No distress.  Cardiovascular: Normal rate, regular rhythm, normal heart sounds and intact distal pulses.   No murmur heard. Pulmonary/Chest: Effort normal and breath sounds normal. No respiratory distress. She has no wheezes. She has no rales.  Abdominal: Soft. Bowel sounds are normal. She exhibits no distension. There is no tenderness. There is no rebound and no guarding.  Skin: She is not diaphoretic.  Vitals reviewed.         Assessment & Plan:  Controlled type 2 diabetes mellitus without complication, without long-term current use of insulin (HCC) - Plan: Microalbumin, urine  Diabetes is now controlled. Her blood pressure is excellent. Her LDL cholesterol is excellent when checked last spring. I have recommended that she start aerobic exercise 30 minutes a day 5 days a week. Monitor for declining blood sugars as we may  need to decrease her metformin dose if she starts exercising and losing weight. I will check a urine microalbumin and if elevated, I will start the patient on an ACE inhibitor. The remainder of her preventative care is up-to-date.

## 2016-05-16 LAB — MICROALBUMIN, URINE: Microalb, Ur: 3.1 mg/dL

## 2016-05-19 ENCOUNTER — Other Ambulatory Visit: Payer: BLUE CROSS/BLUE SHIELD

## 2016-05-22 ENCOUNTER — Ambulatory Visit: Payer: BLUE CROSS/BLUE SHIELD | Admitting: Family Medicine

## 2016-06-05 ENCOUNTER — Encounter: Payer: Self-pay | Admitting: Family Medicine

## 2016-06-05 ENCOUNTER — Ambulatory Visit (INDEPENDENT_AMBULATORY_CARE_PROVIDER_SITE_OTHER): Payer: BLUE CROSS/BLUE SHIELD | Admitting: Family Medicine

## 2016-06-05 VITALS — BP 144/90 | HR 100 | Temp 98.1°F | Resp 18 | Ht 65.5 in | Wt 226.0 lb

## 2016-06-05 DIAGNOSIS — R0789 Other chest pain: Secondary | ICD-10-CM | POA: Diagnosis not present

## 2016-06-05 DIAGNOSIS — M5412 Radiculopathy, cervical region: Secondary | ICD-10-CM

## 2016-06-05 DIAGNOSIS — E119 Type 2 diabetes mellitus without complications: Secondary | ICD-10-CM | POA: Insufficient documentation

## 2016-06-05 MED ORDER — PREDNISONE 20 MG PO TABS: ORAL_TABLET | ORAL | 0 refills | Status: DC

## 2016-06-05 MED ORDER — OXYCODONE-ACETAMINOPHEN 5-325 MG PO TABS: 1.0000 | ORAL_TABLET | Freq: Three times a day (TID) | ORAL | 0 refills | Status: DC | PRN

## 2016-06-05 MED ORDER — CYCLOBENZAPRINE HCL 10 MG PO TABS: 10.0000 mg | ORAL_TABLET | Freq: Three times a day (TID) | ORAL | 0 refills | Status: DC | PRN

## 2016-06-05 MED ORDER — METHYLPREDNISOLONE ACETATE 40 MG/ML IJ SUSP
60.0000 mg | Freq: Once | INTRAMUSCULAR | Status: AC
  Administered 2016-06-05: 60 mg via INTRAMUSCULAR

## 2016-06-05 NOTE — Addendum Note (Signed)
Addended by: Shary Decamp B on: 06/05/2016 04:47 PM   Modules accepted: Orders

## 2016-06-05 NOTE — Progress Notes (Signed)
Subjective:    Patient ID: Christina [name], female    DOB: [birth year], 55 y.o.   MRN: [id number]  HPI Patient is in tears. Symptoms began 1 week ago. Pain initially started in the upper left chest under the clavicle. It subsequently radiated into her shoulder blade. She is now having severe 10 out of 10 pain radiating from her neck into her shoulder blade down her arm into her hand. She reports burning stinging tingling pain in her hand. Pain is exacerbated by turning her head to the left. She is having no shortness of breath. Standing causes the pain to hurt more. The pain is somewhat better when she is lying down and the weight of her head is off her neck. She denies any numbness or tingling in the right arm. She denies any pain in the right arm. She denies any symptoms in her legs. She denies any falls or injuries. She awoke one morning with this. She's been taking her husband's Tylenol with Codeine with minimal relief. She's had similar symptoms in the past and she was told that she had a pinched nerve in her neck. She believes the pain is similar. I did perform an EKG today which shows normal sinus rhythm with normal intervals and normal axis. There is no evidence of ischemia or infarction. There is no significant change when compared to an EKG obtained in the emergency room on 05/11/2014.  Past Medical History:  Diagnosis Date  . Allergy   . Asthma   . GERD (gastroesophageal reflux disease)   . Perforated ulcer ([place])    due to ibuprofen  . Ulcer    perforated  Diabetes mellitus type 2  Past Surgical History:  Procedure Laterality Date  . BUNIONECTOMY    . REPAIR OF PERFORATED ULCER     Allergies  Allergen Reactions  . Influenza Vaccines Other (See Comments)    Seizures and shortness of breath  . Egg White [Albumen, Egg]     Sneezing   . Ibuprofen    Current Outpatient Prescriptions on File Prior to Visit  Medication Sig Dispense Refill  . Beclomethasone Dipropionate (QNASL)  80 MCG/ACT AERS Place 1 spray into the nose daily.    . DiphenhydrAMINE HCl (BENADRYL PO) Take by mouth. 1 or 2 daily as needed    . metFORMIN (GLUCOPHAGE) 1000 MG tablet Take 1 tablet (1,000 mg total) by mouth 2 (two) times daily with a meal. 60 tablet 2  . omeprazole (PRILOSEC) 20 MG capsule Take 1 capsule (20 mg total) by mouth 2 (two) times daily before a meal. 60 capsule 0   No current facility-administered medications on file prior to visit.    Social History   Social History  . Marital status: Married    Spouse name: N/A  . Number of children: N/A  . Years of education: N/A   Occupational History  . Not on file.   Social History Main Topics  . Smoking status: Never Smoker  . Smokeless tobacco: Never Used  . Alcohol use No  . Drug use: No  . Sexual activity: Yes   Other Topics Concern  . Not on file   Social History Narrative  . No narrative on file     Review of Systems  All other systems reviewed and are negative.      Objective:   Physical Exam  Constitutional: She appears well-developed and well-nourished.  Cardiovascular: Normal rate, regular rhythm and normal heart sounds.   Pulmonary/Chest: Effort normal and  breath sounds normal. No respiratory distress. She has no wheezes. She has no rales.  Musculoskeletal:       Left shoulder: She exhibits pain and spasm. She exhibits normal range of motion, no tenderness and no bony tenderness.       Cervical back: She exhibits decreased range of motion, tenderness and pain.  Vitals reviewed.         Assessment & Plan:  Other chest pain - Plan: EKG 12-Lead  Diabetes mellitus without complication (HCC)  Cervical radiculopathy - Plan: predniSONE (DELTASONE) 20 MG tablet, cyclobenzaprine (FLEXERIL) 10 MG tablet, oxyCODONE-acetaminophen (ROXICET) 5-325 MG tablet  EKG is very reassuring. There is no significant change compared to an EKG from 2 years ago. Furthermore her symptoms or not consistent with ischemia.  There have been ongoing for a week unrelated to exertion. The pain seems more due to position of her head and her neck. Her exam is more consistent with cervical radiculopathy. Therefore the patient received 60 mg of Depo-Medrol IM 1. She will then begin a prednisone taper pack starting tomorrow for I suspect could be a herniated disc in her neck. Percocet 5/325 one every 4 hours as needed for pain. She can also use Flexeril as needed for muscle spasms which she is having in her trapezius. Seek medical attention immediately if the symptoms change or she develops chest pain. Today she is having no pain in her chest and it is all centered in her shoulder and seems to be exacerbated with range of motion of her neck. Recheck on Monday if not better.

## 2016-06-06 ENCOUNTER — Ambulatory Visit: Payer: BLUE CROSS/BLUE SHIELD | Admitting: Family Medicine

## 2016-06-17 ENCOUNTER — Ambulatory Visit
Admission: RE | Admit: 2016-06-17 | Discharge: 2016-06-17 | Disposition: A | Discharge status: Home / Self Care | Payer: BLUE CROSS/BLUE SHIELD | Source: Ambulatory Visit | Attending: Gastroenterology | Admitting: Gastroenterology

## 2016-06-17 DIAGNOSIS — R1013 Epigastric pain: Secondary | ICD-10-CM

## 2016-07-15 ENCOUNTER — Other Ambulatory Visit: Payer: Self-pay | Admitting: Surgery

## 2016-07-28 ENCOUNTER — Other Ambulatory Visit: Payer: Self-pay | Admitting: Family Medicine

## 2016-07-29 NOTE — Telephone Encounter (Signed)
Patient called and needs a refill of her Metformin. Her pharmacy Is [facility] on [address]. Thank you

## 2016-09-15 ENCOUNTER — Encounter: Payer: Self-pay | Admitting: Physician Assistant

## 2016-09-15 ENCOUNTER — Ambulatory Visit (INDEPENDENT_AMBULATORY_CARE_PROVIDER_SITE_OTHER): Payer: BLUE CROSS/BLUE SHIELD | Admitting: Physician Assistant

## 2016-09-15 VITALS — BP 150/90 | HR 92 | Temp 98.1°F | Resp 18 | Wt 227.0 lb

## 2016-09-15 DIAGNOSIS — R8290 Unspecified abnormal findings in urine: Secondary | ICD-10-CM

## 2016-09-15 DIAGNOSIS — J3089 Other allergic rhinitis: Secondary | ICD-10-CM | POA: Diagnosis not present

## 2016-09-15 LAB — URINALYSIS, ROUTINE W REFLEX MICROSCOPIC
Bilirubin Urine: NEGATIVE
Glucose, UA: NEGATIVE
Nitrite: NEGATIVE
Specific Gravity, Urine: 1.025 (ref 1.001–1.035)
pH: 5.5 (ref 5.0–8.0)

## 2016-09-15 LAB — URINALYSIS, MICROSCOPIC ONLY
Crystals: NONE SEEN [HPF]
Yeast: NONE SEEN [HPF]

## 2016-09-15 MED ORDER — PREDNISONE 20 MG PO TABS: ORAL_TABLET | ORAL | 0 refills | Status: DC

## 2016-09-15 MED ORDER — MONTELUKAST SODIUM 10 MG PO TABS: 10.0000 mg | ORAL_TABLET | Freq: Every day | ORAL | 3 refills | Status: DC

## 2016-09-15 NOTE — Progress Notes (Signed)
Patient ID: Christina [name] MRN: [id number], DOB: 05/16/1961, 55 y.o. Date of Encounter: 09/15/2016, 3:55 PM    Chief Complaint:  Chief Complaint  Patient presents with  . Allergies    x 2days  . odor to urine     HPI: 54 y.o. year old female presents with above.  She holds her fingers around her eyes on both sides and says that that area has felt irritated and sinus issues for about 2 weeks. Says that over the weekend her eyes got really itchy her nose got really itchy. Also felt like she was wheezing over the weekend. She used some Alka-Seltzer over the weekend. She had already been taking Claritin daily and has continued this. She is having drainage down her throat and blows her nose all through the day but it is watery clear.  She also states that this morning when she used the restroom she noticed that her urine had some smell to it so wanted to check that while she is here as well. She has had no dysuria. No fevers or chills. No vaginal discharge and no vaginal itching or irritation.     Home Meds:   Outpatient Medications Prior to Visit  Medication Sig Dispense Refill  . Beclomethasone Dipropionate (QNASL) 80 MCG/ACT AERS Place 1 spray into the nose daily.    . cyclobenzaprine (FLEXERIL) 10 MG tablet Take 1 tablet (10 mg total) by mouth 3 (three) times daily as needed for muscle spasms. 30 tablet 0  . DiphenhydrAMINE HCl (BENADRYL PO) Take by mouth. 1 or 2 daily as needed    . metFORMIN (GLUCOPHAGE) 1000 MG tablet TAKE ONE TABLET BY MOUTH TWICE DAILY WITH A MEAL 60 tablet 2  . omeprazole (PRILOSEC) 20 MG capsule Take 1 capsule (20 mg total) by mouth 2 (two) times daily before a meal. 60 capsule 0  . oxyCODONE-acetaminophen (ROXICET) 5-325 MG tablet Take 1 tablet by mouth every 8 (eight) hours as needed for severe pain. 30 tablet 0  . predniSONE (DELTASONE) 20 MG tablet 3 tabs poqday 1-2, 2 tabs poqday 3-4, 1 tab poqday 5-6 (Patient not taking: Reported on 09/15/2016) 12  tablet 0   No facility-administered medications prior to visit.     Allergies:  Allergies  Allergen Reactions  . Influenza Vaccines Other (See Comments)    Seizures and shortness of breath  . Egg White [Albumen, Egg]     Sneezing   . Ibuprofen       Review of Systems: See HPI for pertinent ROS. All other ROS negative.    Physical Exam: Blood pressure (!) 150/90, pulse 92, temperature 98.1 F (36.7 C), temperature source Oral, resp. rate 18, weight 227 lb (103 kg), SpO2 98 %., Body mass index is 37.2 kg/m. General:  WNWD WF Appears in no acute distress. HEENT: Normocephalic, atraumatic, eyes without discharge, sclera non-icteric, nares are without discharge. Bilateral auditory canals clear, TM's are without perforation, pearly grey and translucent with reflective cone of light bilaterally. Oral cavity moist, posterior pharynx without exudate, erythema, peritonsillar abscess.  Tenderness with percussion to maxillary sinuses bilaterally. Minimal tenderness with percussion to frontal sinuses.  Neck: Supple. No thyromegaly. No lymphadenopathy. Lungs: Clear bilaterally to auscultation without wheezes, rales, or rhonchi. Breathing is unlabored. Heart: Regular rhythm. No murmurs, rubs, or gallops. Abdomen: Soft, non-tender, non-distended with normoactive bowel sounds. No hepatomegaly. No rebound/guarding. No obvious abdominal masses. Msk:  Strength and tone normal for age. No costophrenic angle tenderness with percussion bilaterally. Extremities/Skin: Warm and  dry. No clubbing or cyanosis. No edema. No rashes or suspicious lesions. Neuro: Alert and oriented X 3. Moves all extremities spontaneously. Gait is normal. CNII-XII grossly in tact. Psych:  Responds to questions appropriately with a normal affect.     ASSESSMENT AND PLAN:  55 y.o. year old female with  1. Acute non-seasonal allergic rhinitis, unspecified trigger She is to continue Claritin daily. She is to add Singulair daily.  Also use prednisone taper. She has used this before and is aware of the possible side effects associated with this including increasing her blood sugar so she needs to be extra strict with carbohydrate intake while taking this. If symptoms not controlled after taking this then follow-up. - montelukast (SINGULAIR) 10 MG tablet; Take 1 tablet (10 mg total) by mouth at bedtime.  Dispense: 30 tablet; Refill: 3 - predniSONE (DELTASONE) 20 MG tablet; Take 3 daily for 2 days, then 2 daily for 2 days, then 1 daily for 2 days.  Dispense: 12 tablet; Refill: 0  2. Abnormal urine odor Urinalysis is not completely normal so will send culture for further verification. - Urinalysis, Routine w reflex microscopic (not at [facility]) - Urine culture   Signed, [facility] [place], [place], [facility] 09/15/2016 3:55 PM

## 2016-09-17 ENCOUNTER — Telehealth: Payer: Self-pay

## 2016-09-17 LAB — URINE CULTURE

## 2016-09-17 MED ORDER — CIPROFLOXACIN HCL 500 MG PO TABS: 500.0000 mg | ORAL_TABLET | Freq: Two times a day (BID) | ORAL | 0 refills | Status: DC

## 2016-09-17 NOTE — Telephone Encounter (Signed)
Rx sent  In for pt for antibiotic

## 2016-10-13 ENCOUNTER — Encounter: Payer: Self-pay | Admitting: Physician Assistant

## 2016-10-13 ENCOUNTER — Ambulatory Visit (INDEPENDENT_AMBULATORY_CARE_PROVIDER_SITE_OTHER): Payer: BLUE CROSS/BLUE SHIELD | Admitting: Physician Assistant

## 2016-10-13 ENCOUNTER — Telehealth: Payer: Self-pay | Admitting: Family Medicine

## 2016-10-13 VITALS — BP 160/98 | HR 90 | Temp 98.1°F | Resp 18 | Wt 227.0 lb

## 2016-10-13 DIAGNOSIS — T7840XS Allergy, unspecified, sequela: Secondary | ICD-10-CM | POA: Diagnosis not present

## 2016-10-13 DIAGNOSIS — J4520 Mild intermittent asthma, uncomplicated: Secondary | ICD-10-CM | POA: Diagnosis not present

## 2016-10-13 DIAGNOSIS — Z87440 Personal history of urinary (tract) infections: Secondary | ICD-10-CM

## 2016-10-13 LAB — URINALYSIS, MICROSCOPIC ONLY
Casts: NONE SEEN [LPF]
Crystals: NONE SEEN [HPF]
Yeast: NONE SEEN [HPF]

## 2016-10-13 LAB — URINALYSIS, ROUTINE W REFLEX MICROSCOPIC
Bilirubin Urine: NEGATIVE
Glucose, UA: NEGATIVE
Hgb urine dipstick: NEGATIVE
Ketones, ur: NEGATIVE
Nitrite: NEGATIVE
Specific Gravity, Urine: 1.015 (ref 1.001–1.035)
pH: 5.5 (ref 5.0–8.0)

## 2016-10-13 MED ORDER — EPINEPHRINE 0.3 MG/0.3ML IJ SOAJ: 0.3000 mg | Freq: Once | INTRAMUSCULAR | 0 refills | Status: AC

## 2016-10-13 NOTE — Progress Notes (Signed)
Patient ID: Christina [name] MRN: [id number], DOB: 02-24-1961, 55 y.o. Date of Encounter: 10/13/2016, 2:50 PM    Chief Complaint:  Chief Complaint  Patient presents with  . allergy attack    itching all over      HPI: 55 y.o. year old female presents with above.   Says that over the last couple of days she had been itching all over and thought it was because of winter air being drier and having to run the heat and having dry skin. Did not see any rash or anything but just felt itchy. This morning she felt significant increased amount of itching on her neck and on her back. At that time also felt like she was wheezing. She took a Benadryl and called and scheduled this appointment. States that the symptoms have resolved since taking the Benadryl. She states that she used to get allergy shots once a week. She was without insurance for a while so had discontinued them and never restarted. Says that she has not been around anything new/different recently and doesn'twhat has caused her allergies to act up.  She also was recently treated for UTI and wants to recheck her urine while she is here to make sure that has cleared up.     Home Meds:   Outpatient Medications Prior to Visit  Medication Sig Dispense Refill  . Beclomethasone Dipropionate (QNASL) 80 MCG/ACT AERS Place 1 spray into the nose daily.    . DiphenhydrAMINE HCl (BENADRYL PO) Take by mouth. 1 or 2 daily as needed    . metFORMIN (GLUCOPHAGE) 1000 MG tablet TAKE ONE TABLET BY MOUTH TWICE DAILY WITH A MEAL 60 tablet 2  . omeprazole (PRILOSEC) 20 MG capsule Take 1 capsule (20 mg total) by mouth 2 (two) times daily before a meal. 60 capsule 0  . ciprofloxacin (CIPRO) 500 MG tablet Take 1 tablet (500 mg total) by mouth 2 (two) times daily. (Patient not taking: Reported on 10/13/2016) 14 tablet 0  . cyclobenzaprine (FLEXERIL) 10 MG tablet Take 1 tablet (10 mg total) by mouth 3 (three) times daily as needed for muscle spasms.  (Patient not taking: Reported on 10/13/2016) 30 tablet 0  . montelukast (SINGULAIR) 10 MG tablet Take 1 tablet (10 mg total) by mouth at bedtime. (Patient not taking: Reported on 10/13/2016) 30 tablet 3  . oxyCODONE-acetaminophen (ROXICET) 5-325 MG tablet Take 1 tablet by mouth every 8 (eight) hours as needed for severe pain. (Patient not taking: Reported on 10/13/2016) 30 tablet 0  . predniSONE (DELTASONE) 20 MG tablet 3 tabs poqday 1-2, 2 tabs poqday 3-4, 1 tab poqday 5-6 (Patient not taking: Reported on 10/13/2016) 12 tablet 0  . predniSONE (DELTASONE) 20 MG tablet Take 3 daily for 2 days, then 2 daily for 2 days, then 1 daily for 2 days. (Patient not taking: Reported on 10/13/2016) 12 tablet 0   No facility-administered medications prior to visit.     Allergies:  Allergies  Allergen Reactions  . Influenza Vaccines Other (See Comments)    Seizures and shortness of breath  . Egg White [Albumen, Egg]     Sneezing   . Ibuprofen       Review of Systems: See HPI for pertinent ROS. All other ROS negative.    Physical Exam: 142/ 86 Blood pressure , pulse 90, temperature 98.1 F (36.7 C), temperature source Oral, resp. rate 18, weight 227 lb (103 kg), SpO2 98 %., Body mass index is 37.2 kg/m. General:  Obese WF.  Appears in no acute distress. Neck: Supple. No thyromegaly. No lymphadenopathy. Lungs: Clear bilaterally to auscultation without wheezes, rales, or rhonchi. Breathing is unlabored. Lungs are clear with no wheezing. Oxygen saturation 98% on room air. Heart: Regular rhythm. No murmurs, rubs, or gallops. Msk:  Strength and tone normal for age. Extremities/Skin: There is no rash present at this time Neuro: Alert and oriented X 3. Moves all extremities spontaneously. Gait is normal. CNII-XII grossly in tact. Psych:  Responds to questions appropriately with a normal affect.     ASSESSMENT AND PLAN:  55 y.o. year old female with  1. Allergic state, sequela Currently  controlled/resolved after Benadryl. Continue to use Benadryl as needed. Also will give her EpiPen to have available if indicated. Will refer her to an allergist to follow up. - Ambulatory referral to Allergy - EPINEPHrine 0.3 mg/0.3 mL IJ SOAJ injection; Inject 0.3 mLs (0.3 mg total) into the muscle once.  Dispense: 1 Device; Refill: 0  2. Mild intermittent asthma without complication  3. Hx: UTI (urinary tract infection) Will check f/u UA to f/u after recent UTI - Urine culture - Urinalysis, Routine w reflex microscopic (not at [facility])   Signed, [facility] [place], [place], [facility] 10/13/2016 2:50 PM

## 2016-10-13 NOTE — Telephone Encounter (Signed)
Pt is having bad allergy attack.  Has been coming on for a few days.  Sounds terrible on the phone.  Heavy breathing and wheezing.  She wants something called in.  Told pt she sounded terrible and really should be seen.  Only wants to be seen by Dr Dennard Schaumann, who has no appt until Friday.  Really stressed to pt how bad she sounded and recommended Urgent care or ED today as she did not want to see another provder here.  She then took appt with PA here later today.  Michela Pitcher will call and cancel if goes to ED

## 2016-10-15 LAB — URINE CULTURE: Organism ID, Bacteria: NO GROWTH

## 2016-10-24 ENCOUNTER — Ambulatory Visit: Payer: BLUE CROSS/BLUE SHIELD | Admitting: Family Medicine

## 2016-10-27 ENCOUNTER — Encounter: Payer: Self-pay | Admitting: Family Medicine

## 2016-10-27 ENCOUNTER — Ambulatory Visit (INDEPENDENT_AMBULATORY_CARE_PROVIDER_SITE_OTHER): Payer: BLUE CROSS/BLUE SHIELD | Admitting: Family Medicine

## 2016-10-27 VITALS — BP 162/96 | HR 98 | Temp 98.1°F | Resp 18 | Ht 65.5 in | Wt 227.0 lb

## 2016-10-27 DIAGNOSIS — J328 Other chronic sinusitis: Secondary | ICD-10-CM

## 2016-10-27 MED ORDER — FLUTICASONE PROPIONATE 50 MCG/ACT NA SUSP: 2.0000 | Freq: Every day | NASAL | 6 refills | Status: DC

## 2016-10-27 MED ORDER — PREDNISONE 20 MG PO TABS: 40.0000 mg | ORAL_TABLET | Freq: Every day | ORAL | 0 refills | Status: DC

## 2016-10-27 MED ORDER — CEFDINIR 300 MG PO CAPS: 300.0000 mg | ORAL_CAPSULE | Freq: Two times a day (BID) | ORAL | 0 refills | Status: DC

## 2016-10-27 NOTE — Progress Notes (Signed)
Subjective:    Patient ID: Christina [name], female    DOB: 1961/04/01, 55 y.o.   MRN: [id number]  HPI Patient states that she has felt poorly since November. Symptoms began with itchy watery eyes, nasal congestion, frontal sinus headache and pressure. She was seen in early November by my physician assistant who prescribed prednisone which significantly helped with her symptoms. However around the same time she was also started on an antibiotic for presumed urinary tract infection. Urine culture revealed group B strep which is normal vaginal contaminant. Daily after discontinuing the prednisone and antibiotic, the patient's symptoms persisted and continued. She is therefore had constant maxillary and frontal sinus pain and pressure now for more than 6 weeks. She reports postnasal drip, nighttime cough, frontal sinus headache, maxillary sinus pain and pressure. She denies any rhinorrhea. Does have an underlying history of allergies Past Medical History:  Diagnosis Date  . Allergy   . Asthma   . Diabetes mellitus without complication ([place])   . GERD (gastroesophageal reflux disease)   . Perforated ulcer ([place])    due to ibuprofen  . Ulcer ([place])    perforated   Past Surgical History:  Procedure Laterality Date  . BUNIONECTOMY    . REPAIR OF PERFORATED ULCER     Current Outpatient Prescriptions on File Prior to Visit  Medication Sig Dispense Refill  . Beclomethasone Dipropionate (QNASL) 80 MCG/ACT AERS Place 1 spray into the nose daily.    . cyclobenzaprine (FLEXERIL) 10 MG tablet Take 1 tablet (10 mg total) by mouth 3 (three) times daily as needed for muscle spasms. 30 tablet 0  . metFORMIN (GLUCOPHAGE) 1000 MG tablet TAKE ONE TABLET BY MOUTH TWICE DAILY WITH A MEAL 60 tablet 2  . omeprazole (PRILOSEC) 20 MG capsule Take 1 capsule (20 mg total) by mouth 2 (two) times daily before a meal. 60 capsule 0   No current facility-administered medications on file prior to visit.    Allergies    Allergen Reactions  . Influenza Vaccines Other (See Comments)    Seizures and shortness of breath  . Egg White [Albumen, Egg]     Sneezing   . Ibuprofen    Social History   Social History  . Marital status: Married    Spouse name: N/A  . Number of children: N/A  . Years of education: N/A   Occupational History  . Not on file.   Social History Main Topics  . Smoking status: Never Smoker  . Smokeless tobacco: Never Used  . Alcohol use No  . Drug use: No  . Sexual activity: Yes   Other Topics Concern  . Not on file   Social History Narrative  . No narrative on file      Review of Systems  All other systems reviewed and are negative.      Objective:   Physical Exam  Constitutional: She appears well-developed and well-nourished.  HENT:  Head: Normocephalic and atraumatic.  Right Ear: Tympanic membrane, external ear and ear canal normal.  Left Ear: Tympanic membrane, external ear and ear canal normal.  Nose: Mucosal edema and septal deviation present. No rhinorrhea. Right sinus exhibits maxillary sinus tenderness and frontal sinus tenderness. Left sinus exhibits maxillary sinus tenderness and frontal sinus tenderness.  Mouth/Throat: Oropharynx is clear and moist. No oropharyngeal exudate.  Cardiovascular: Normal rate, regular rhythm and normal heart sounds.   No murmur heard. Vitals reviewed.         Assessment & Plan:  Other chronic  sinusitis - Plan: cefdinir (OMNICEF) 300 MG capsule, predniSONE (DELTASONE) 20 MG tablet  Begin Omnicef 300 mg by mouth twice a day for 10 days in addition to prednisone 40 mg by mouth daily for 10 days. Recheck at the end of that time. If symptoms persist, consult ENT versus a sinus CT. His symptoms resolve and come back, focus on allergy management as I believe allergies with the trigger that initially started all this. Therefore I recommended the patient continue Flonase daily even after symptoms resolve as prevention.

## 2016-11-04 ENCOUNTER — Other Ambulatory Visit: Payer: Self-pay | Admitting: Family Medicine

## 2016-11-28 ENCOUNTER — Telehealth: Payer: Self-pay | Admitting: Family Medicine

## 2016-11-28 NOTE — Telephone Encounter (Signed)
Patient is calling regarding a bill and has some questions  [phone]

## 2017-01-01 ENCOUNTER — Other Ambulatory Visit: Payer: Self-pay | Admitting: Surgery

## 2017-01-01 DIAGNOSIS — D241 Benign neoplasm of right breast: Secondary | ICD-10-CM

## 2017-01-08 NOTE — Telephone Encounter (Signed)
Spoke with patient and she is aware that the bill was coming from her deductible amount

## 2017-01-12 ENCOUNTER — Encounter (HOSPITAL_COMMUNITY)
Admission: RE | Admit: 2017-01-12 | Discharge: 2017-01-12 | Disposition: A | Discharge status: Home / Self Care | Payer: BLUE CROSS/BLUE SHIELD | Source: Ambulatory Visit | Attending: Surgery | Admitting: Surgery

## 2017-01-12 ENCOUNTER — Encounter (HOSPITAL_COMMUNITY): Payer: Self-pay

## 2017-01-12 DIAGNOSIS — Z01812 Encounter for preprocedural laboratory examination: Secondary | ICD-10-CM | POA: Insufficient documentation

## 2017-01-12 DIAGNOSIS — D241 Benign neoplasm of right breast: Secondary | ICD-10-CM

## 2017-01-12 HISTORY — DX: Other intervertebral disc degeneration, lumbar region without mention of lumbar back pain or lower extremity pain: M51.369

## 2017-01-12 HISTORY — DX: Allergy status to unspecified drugs, medicaments and biological substances: Z88.9

## 2017-01-12 HISTORY — DX: Unspecified osteoarthritis, unspecified site: M19.90

## 2017-01-12 HISTORY — DX: Other intervertebral disc degeneration, lumbar region: M51.36

## 2017-01-12 LAB — BASIC METABOLIC PANEL
Anion gap: 9 (ref 5–15)
BUN: 7 mg/dL (ref 6–20)
CO2: 23 mmol/L (ref 22–32)
Calcium: 9.4 mg/dL (ref 8.9–10.3)
Chloride: 107 mmol/L (ref 101–111)
Creatinine, Ser: 0.81 mg/dL (ref 0.44–1.00)
GFR calc Af Amer: >60 mL/min (ref >60)
GFR calc non Af Amer: >60 mL/min (ref >60)
Glucose, Bld: 139 mg/dL — ABNORMAL HIGH (ref 65–99)
Potassium: 3.8 mmol/L (ref 3.5–5.1)
Sodium: 139 mmol/L (ref 135–145)

## 2017-01-12 LAB — CBC
HCT: 40.2 % (ref 36.0–46.0)
Hemoglobin: 13.8 g/dL (ref 12.0–15.0)
MCH: 29.6 pg (ref 26.0–34.0)
MCHC: 34.3 g/dL (ref 30.0–36.0)
MCV: 86.3 fL (ref 78.0–100.0)
Platelets: 289 10*3/uL (ref 150–400)
RBC: 4.66 MIL/uL (ref 3.87–5.11)
RDW: 12.6 % (ref 11.5–15.5)
WBC: 10 10*3/uL (ref 4.0–10.5)

## 2017-01-12 LAB — GLUCOSE, CAPILLARY: Glucose-Capillary: 174 mg/dL — ABNORMAL HIGH (ref 65–99)

## 2017-01-12 NOTE — Progress Notes (Signed)
Christina [name] reports that CBGs have been running 120 - 174. Patient checks CBG 2 times a day.   Christina [name] denies chest pain or shortness of breath. PCP is Dr Dennard Schaumann.

## 2017-01-12 NOTE — Pre-Procedure Instructions (Signed)
Christina [name]  01/12/2017    Your procedure is scheduled on Thursday, March 8.  Report to [facility] Admitting at 9:15 A.M.                 Your surgery or procedure is scheduled for 11:15 AM   Call this number if you have problems the morning of surgery: .[phone]               For any other questions, please call [phone], Monday - Friday 8 AM - 4 PM.    Remember:  Do not eat food or drink liquids after midnight Wednesday, March 7.  Take these medicines the morning of surgery with A SIP OF WATER:  . May take if needed: loratadine (CLARITIN) or cetirizine (ZYRTEC), acetaminophen (TYLENOL),. May use Flonase nasal spray.  May use eye drops.                Do not take metFORMIN (GLUCOPHAGE)              STOP taking Aspirin, Aspirin Products (Goody Powder, Excedrin Migraine), Ibuprofen (Advil) Naproxen (Aleve), Viiamins and Herbal Products (ie Fish Oil)  How to Manage Your Diabetes Before and After Surgery  Why is it important to control my blood sugar before and after surgery? . Improving blood sugar levels before and after surgery helps healing and can limit problems. . A way of improving blood sugar control is eating a healthy diet by: o  Eating less sugar and carbohydrates o  Increasing activity/exercise o  Talking with your doctor about reaching your blood sugar goals . High blood sugars (greater than 180 mg/dL) can raise your risk of infections and slow your recovery, so you will need to focus on controlling your diabetes during the weeks before surgery. . Make sure that the doctor who takes care of your diabetes knows about your planned surgery including the date and location.  How do I manage my blood sugar before surgery? . Check your blood sugar at least 4 times a day, starting 2 days before surgery, to make sure that the level is not too high or low. o Check your blood sugar the morning of your surgery when you wake up and every 2 hours until you get  to the Short Stay unit. . If your blood sugar is less than 70 mg/dL, you will need to treat for low blood sugar: o Do not take insulin. o Treat a low blood sugar (less than 70 mg/dL) with  cup of clear juice (cranberry or apple), 4 glucose tablets, OR glucose gel. o Recheck blood sugar in 15 minutes after treatment (to make sure it is greater than 70 mg/dL). If your blood sugar is not greater than 70 mg/dL on recheck, call [phone] for further instructions. . Report your blood sugar to the short stay nurse when you get to Short Stay.  . If you are admitted to the hospital after surgery: o Your blood sugar will be checked by the staff and you will probably be given insulin after surgery (instead of oral diabetes medicines) to make sure you have good blood sugar levels. o The goal for blood sugar control after surgery is 80-180 mg/dL.  WHAT DO I DO ABOUT MY DIABETES MEDICATION . Do not take oral diabetes medicines (pills) the morning of surgery.   Do not wear jewelry, make-up or nail polish.  Do not wear lotions, powders, or perfumes, or deodorant.  Do not shave 48  hours prior to surgery.  Men may shave face and neck.  Do not bring valuables to the hospital.  [facility] is not responsible for any belongings or valuables.  Contacts, dentures or bridgework may not be worn into surgery.  Leave your suitcase in the car.  After surgery it may be brought to your room.  For patients admitted to the hospital, discharge time will be determined by your treatment team.  Patients discharged the day of surgery will not be allowed to drive home.   Name and phone number of your driver:   -  Special instructions:  Review  [place] - Preparing For Surgery.  Please read over the following fact sheets that you were given: Review  [place] - Preparing For Surgery, Coughing and Deep Breathing and Pain Booklet

## 2017-01-13 LAB — HEMOGLOBIN A1C
Hgb A1c MFr Bld: 6 % — ABNORMAL HIGH (ref 4.8–5.6)
Mean Plasma Glucose: 126 mg/dL

## 2017-01-14 NOTE — H&P (Signed)
Christina [name] 01/01/2017 2:03 PM Location: [facility] Patient #: [id number] DOB: [birth year] Married / Language: English / Race: White Female   History of Present Illness (Trellis Guirguis A. Ninfa Linden MD; 01/01/2017 2:30 PM) The patient is a 56 year old female who presents with a who presents with a breast mass.  Additional reasons for visit:  Gall stones is described as the following: Mrs. Christina [name] is a pleasant patient I saw recently for symptomatic cholelithiasis versus ulcer disease. She most recently has been having discomfort in her right breast and during workup was found to have a right breast papilloma with dilated ducts. She's had no nipple discharge that is itching of the nipple. She has no prior history of breast cancer or breast surgery. Regarding her gallstones, she was treated with H. pylori medications and still has right upper quadrant abdominal discomfort after eating fatty meals. She has some nausea but no vomiting.   Allergies Bary Castilla [place], [place]; 01/01/2017 2:04 PM) Allergies Reconciled  No Known Drug Allergies 07/15/2016  Medication History Nance Pear, [place]; 01/01/2017 2:04 PM) MetFORMIN HCl (1000MG  Tablet, Oral) Active. Claritin (10MG  Tablet, Oral) Active. NexIUM (40MG  Capsule DR, Oral) Active. Medications Reconciled  Vitals Bary Castilla [place] CMA; 01/01/2017 2:04 PM) 01/01/2017 2:04 PM Weight: 222.8 lb Height: 66in Body Surface Area: 2.09 m Body Mass Index: 35.96 kg/m  Temp.: 97.53F  Pulse: 104 (Regular)  BP: 142/82 (Sitting, Left Arm, Standard)       Physical Exam (Kiet Geer A. Ninfa Linden MD; 01/01/2017 2:30 PM) General Mental Status-Alert. General Appearance-Consistent with stated age. Hydration-Well hydrated. Voice-Normal.  Head and Neck Head-normocephalic, atraumatic with no lesions or palpable masses.  Eye Eyeball - Bilateral-Extraocular movements intact. Sclera/Conjunctiva - Bilateral-No scleral  icterus.  Chest and Lung Exam Chest and lung exam reveals -quiet, even and easy respiratory effort with no use of accessory muscles and on auscultation, normal breath sounds, no adventitious sounds and normal vocal resonance. Inspection Chest Wall - Normal. Back - normal.  Breast Note: There are no palpable masses in either breast and no axillary adenopathy. I can elicit no nipple discharge from the right breast   Cardiovascular Cardiovascular examination reveals -on palpation PMI is normal in location and amplitude, no palpable S3 or S4. Normal cardiac borders., normal heart sounds, regular rate and rhythm with no murmurs, carotid auscultation reveals no bruits and normal pedal pulses bilaterally.  Abdomen Inspection Inspection of the abdomen reveals - No Hernias. Skin - Scar - no surgical scars. Palpation/Percussion Palpation and Percussion of the abdomen reveal - Soft, Non Tender, No Rebound tenderness, No Rigidity (guarding) and No hepatosplenomegaly. Auscultation Auscultation of the abdomen reveals - Bowel sounds normal.  Neurologic Neurologic evaluation reveals -alert and oriented x 3 with no impairment of recent or remote memory. Mental Status-Normal.  Musculoskeletal Normal Exam - Left-Upper Extremity Strength Normal and Lower Extremity Strength Normal. Normal Exam - Right-Upper Extremity Strength Normal, Lower Extremity Weakness.    Assessment & Plan (Jeidy Hoerner A. 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 Regarding the papilloma, a radioactive seed right breast lumpectomy is recommended for histologic evaluation to help with her symptoms and to prevent future malignancy. I again discussed her gallbladder disease with her as well. She does seem symptomatic now so laparoscopic cholecystectomy is recommended. I discussed her  surgical procedure with her in detail. I discussed the risks of cholecystectomy. These include but are not limited to bleeding,  infection, conversion to an open procedure given her previous surgery, injury to surrounding structures, postoperative recovery, etc. I discussed the risks of the breast surgery which includes but is not limited to bleeding, infection, and need for further surgery if malignancy is found. She understands and wished to proceed with both procedures INTRADUCTAL PAPILLOMA OF BREAST, RIGHT (D24.1)

## 2017-01-15 ENCOUNTER — Ambulatory Visit (HOSPITAL_COMMUNITY): Payer: BLUE CROSS/BLUE SHIELD | Admitting: Anesthesiology

## 2017-01-15 ENCOUNTER — Encounter: Payer: Self-pay | Admitting: Family Medicine

## 2017-01-15 ENCOUNTER — Encounter (HOSPITAL_COMMUNITY)
Admission: RE | Disposition: A | Discharge status: Home / Self Care | Payer: Self-pay | Source: Ambulatory Visit | Attending: Surgery

## 2017-01-15 ENCOUNTER — Ambulatory Visit (HOSPITAL_COMMUNITY)
Admission: RE | Admit: 2017-01-15 | Discharge: 2017-01-15 | Disposition: A | Discharge status: Home / Self Care | Payer: BLUE CROSS/BLUE SHIELD | Source: Ambulatory Visit | Attending: Surgery | Admitting: Surgery

## 2017-01-15 ENCOUNTER — Encounter (HOSPITAL_COMMUNITY): Payer: Self-pay | Admitting: Anesthesiology

## 2017-01-15 DIAGNOSIS — Z87891 Personal history of nicotine dependence: Secondary | ICD-10-CM | POA: Insufficient documentation

## 2017-01-15 DIAGNOSIS — N6011 Diffuse cystic mastopathy of right breast: Secondary | ICD-10-CM | POA: Diagnosis not present

## 2017-01-15 DIAGNOSIS — E118 Type 2 diabetes mellitus with unspecified complications: Secondary | ICD-10-CM | POA: Diagnosis not present

## 2017-01-15 DIAGNOSIS — K8010 Calculus of gallbladder with chronic cholecystitis without obstruction: Secondary | ICD-10-CM | POA: Insufficient documentation

## 2017-01-15 DIAGNOSIS — K219 Gastro-esophageal reflux disease without esophagitis: Secondary | ICD-10-CM | POA: Diagnosis not present

## 2017-01-15 DIAGNOSIS — Z7984 Long term (current) use of oral hypoglycemic drugs: Secondary | ICD-10-CM | POA: Diagnosis not present

## 2017-01-15 DIAGNOSIS — K8020 Calculus of gallbladder without cholecystitis without obstruction: Secondary | ICD-10-CM | POA: Diagnosis present

## 2017-01-15 DIAGNOSIS — Z79899 Other long term (current) drug therapy: Secondary | ICD-10-CM | POA: Diagnosis not present

## 2017-01-15 DIAGNOSIS — M1991 Primary osteoarthritis, unspecified site: Secondary | ICD-10-CM | POA: Diagnosis not present

## 2017-01-15 DIAGNOSIS — D241 Benign neoplasm of right breast: Secondary | ICD-10-CM

## 2017-01-15 HISTORY — PX: BREAST LUMPECTOMY WITH RADIOACTIVE SEED LOCALIZATION: SHX6424

## 2017-01-15 HISTORY — PX: CHOLECYSTECTOMY: SHX55

## 2017-01-15 LAB — GLUCOSE, CAPILLARY
Glucose-Capillary: 134 mg/dL — ABNORMAL HIGH (ref 65–99)
Glucose-Capillary: 133 mg/dL — ABNORMAL HIGH (ref 65–99)

## 2017-01-15 SURGERY — BREAST LUMPECTOMY WITH RADIOACTIVE SEED LOCALIZATION
Anesthesia: General | Site: Breast | Laterality: Right

## 2017-01-15 MED ORDER — FENTANYL CITRATE (PF) 100 MCG/2ML IJ SOLN
INTRAMUSCULAR | Status: DC | PRN
  Administered 2017-01-15: 25 ug via INTRAVENOUS
  Administered 2017-01-15 (×2): 50 ug via INTRAVENOUS
  Administered 2017-01-15: 25 ug via INTRAVENOUS
  Administered 2017-01-15: 50 ug via INTRAVENOUS

## 2017-01-15 MED ORDER — FENTANYL CITRATE (PF) 100 MCG/2ML IJ SOLN
INTRAMUSCULAR | Status: AC
  Filled 2017-01-15: qty 4

## 2017-01-15 MED ORDER — HYDROMORPHONE HCL 1 MG/ML IJ SOLN
INTRAMUSCULAR | Status: AC
  Filled 2017-01-15: qty 1

## 2017-01-15 MED ORDER — HYDROMORPHONE HCL 1 MG/ML IJ SOLN
0.2500 mg | INTRAMUSCULAR | Status: DC | PRN
  Administered 2017-01-15 (×4): 0.5 mg via INTRAVENOUS

## 2017-01-15 MED ORDER — SODIUM CHLORIDE 0.9% FLUSH: 3.0000 mL | INTRAVENOUS | Status: DC | PRN

## 2017-01-15 MED ORDER — LACTATED RINGERS IV SOLN
INTRAVENOUS | Status: DC
  Administered 2017-01-15: 10:00:00 via INTRAVENOUS
  Administered 2017-01-15: 50 mL/h via INTRAVENOUS

## 2017-01-15 MED ORDER — SUGAMMADEX SODIUM 200 MG/2ML IV SOLN
INTRAVENOUS | Status: AC
  Filled 2017-01-15: qty 2

## 2017-01-15 MED ORDER — ONDANSETRON HCL 4 MG/2ML IJ SOLN
INTRAMUSCULAR | Status: AC
  Filled 2017-01-15: qty 2

## 2017-01-15 MED ORDER — CEFAZOLIN SODIUM-DEXTROSE 2-4 GM/100ML-% IV SOLN
2.0000 g | INTRAVENOUS | Status: AC
  Administered 2017-01-15: 2 g via INTRAVENOUS

## 2017-01-15 MED ORDER — EPHEDRINE SULFATE 50 MG/ML IJ SOLN
INTRAMUSCULAR | Status: DC | PRN
  Administered 2017-01-15: 5 mg via INTRAVENOUS

## 2017-01-15 MED ORDER — DEXAMETHASONE SODIUM PHOSPHATE 10 MG/ML IJ SOLN
INTRAMUSCULAR | Status: DC | PRN
  Administered 2017-01-15: 5 mg via INTRAVENOUS

## 2017-01-15 MED ORDER — SODIUM CHLORIDE 0.9 % IV SOLN: 250.0000 mL | INTRAVENOUS | Status: DC | PRN

## 2017-01-15 MED ORDER — ROCURONIUM BROMIDE 100 MG/10ML IV SOLN
INTRAVENOUS | Status: DC | PRN
  Administered 2017-01-15: 50 mg via INTRAVENOUS

## 2017-01-15 MED ORDER — SODIUM CHLORIDE 0.9% FLUSH: 3.0000 mL | Freq: Two times a day (BID) | INTRAVENOUS | Status: DC

## 2017-01-15 MED ORDER — SODIUM CHLORIDE 0.9 % IR SOLN
Status: DC | PRN
  Administered 2017-01-15: 1000 mL

## 2017-01-15 MED ORDER — PROPOFOL 10 MG/ML IV BOLUS
INTRAVENOUS | Status: AC
  Filled 2017-01-15: qty 20

## 2017-01-15 MED ORDER — LIDOCAINE HCL (CARDIAC) 20 MG/ML IV SOLN
INTRAVENOUS | Status: DC | PRN
  Administered 2017-01-15: 60 mg via INTRAVENOUS

## 2017-01-15 MED ORDER — 0.9 % SODIUM CHLORIDE (POUR BTL) OPTIME
TOPICAL | Status: DC | PRN
  Administered 2017-01-15: 1000 mL

## 2017-01-15 MED ORDER — MORPHINE SULFATE (PF) 2 MG/ML IV SOLN: 1.0000 mg | INTRAVENOUS | Status: DC | PRN

## 2017-01-15 MED ORDER — ARTIFICIAL TEARS OP OINT
TOPICAL_OINTMENT | OPHTHALMIC | Status: DC | PRN
  Administered 2017-01-15: 1 via OPHTHALMIC

## 2017-01-15 MED ORDER — MIDAZOLAM HCL 2 MG/2ML IJ SOLN
INTRAMUSCULAR | Status: AC
  Filled 2017-01-15: qty 4

## 2017-01-15 MED ORDER — ARTIFICIAL TEARS OP OINT
TOPICAL_OINTMENT | OPHTHALMIC | Status: AC
Start: 2017-01-15 — End: 2017-01-15
  Filled 2017-01-15: qty 3.5

## 2017-01-15 MED ORDER — MIDAZOLAM HCL 5 MG/5ML IJ SOLN
INTRAMUSCULAR | Status: DC | PRN
  Administered 2017-01-15: 2 mg via INTRAVENOUS

## 2017-01-15 MED ORDER — ACETAMINOPHEN 325 MG PO TABS
650.0000 mg | ORAL_TABLET | ORAL | Status: DC | PRN
  Filled 2017-01-15: qty 2

## 2017-01-15 MED ORDER — EPHEDRINE 5 MG/ML INJ
INTRAVENOUS | Status: AC
  Filled 2017-01-15: qty 10

## 2017-01-15 MED ORDER — CHLORHEXIDINE GLUCONATE CLOTH 2 % EX PADS: 6.0000 | MEDICATED_PAD | Freq: Once | CUTANEOUS | Status: DC

## 2017-01-15 MED ORDER — SUGAMMADEX SODIUM 200 MG/2ML IV SOLN
INTRAVENOUS | Status: DC | PRN
  Administered 2017-01-15: 200 mg via INTRAVENOUS

## 2017-01-15 MED ORDER — BUPIVACAINE HCL (PF) 0.25 % IJ SOLN
INTRAMUSCULAR | Status: AC
  Filled 2017-01-15: qty 30

## 2017-01-15 MED ORDER — CEFAZOLIN SODIUM-DEXTROSE 2-4 GM/100ML-% IV SOLN
INTRAVENOUS | Status: AC
  Filled 2017-01-15: qty 100

## 2017-01-15 MED ORDER — PROPOFOL 10 MG/ML IV BOLUS
INTRAVENOUS | Status: DC | PRN
  Administered 2017-01-15: 200 mg via INTRAVENOUS

## 2017-01-15 MED ORDER — OXYCODONE-ACETAMINOPHEN 5-325 MG PO TABS: ORAL_TABLET | ORAL | 0 refills | Status: DC

## 2017-01-15 MED ORDER — ONDANSETRON HCL 4 MG/2ML IJ SOLN
INTRAMUSCULAR | Status: DC | PRN
  Administered 2017-01-15: 4 mg via INTRAVENOUS

## 2017-01-15 MED ORDER — FENTANYL CITRATE (PF) 100 MCG/2ML IJ SOLN
INTRAMUSCULAR | Status: AC
  Filled 2017-01-15: qty 2

## 2017-01-15 MED ORDER — PROMETHAZINE HCL 25 MG/ML IJ SOLN: 6.2500 mg | INTRAMUSCULAR | Status: DC | PRN

## 2017-01-15 MED ORDER — LIDOCAINE 2% (20 MG/ML) 5 ML SYRINGE
INTRAMUSCULAR | Status: AC
  Filled 2017-01-15: qty 5

## 2017-01-15 MED ORDER — ACETAMINOPHEN 650 MG RE SUPP
650.0000 mg | RECTAL | Status: DC | PRN
  Filled 2017-01-15: qty 1

## 2017-01-15 MED ORDER — DEXAMETHASONE SODIUM PHOSPHATE 10 MG/ML IJ SOLN
INTRAMUSCULAR | Status: AC
  Filled 2017-01-15: qty 1

## 2017-01-15 MED ORDER — OXYCODONE HCL 5 MG PO TABS
ORAL_TABLET | ORAL | Status: AC
  Filled 2017-01-15: qty 2

## 2017-01-15 MED ORDER — BUPIVACAINE HCL (PF) 0.25 % IJ SOLN
INTRAMUSCULAR | Status: DC | PRN
  Administered 2017-01-15: 20 mL
  Administered 2017-01-15: 10 mL

## 2017-01-15 MED ORDER — OXYCODONE HCL 5 MG PO TABS
5.0000 mg | ORAL_TABLET | ORAL | Status: DC | PRN
  Administered 2017-01-15: 10 mg via ORAL

## 2017-01-15 SURGICAL SUPPLY — 55 items
APPLIER CLIP 5 13 M/L LIGAMAX5 (MISCELLANEOUS) ×3
APPLIER CLIP 9.375 MED OPEN (MISCELLANEOUS) ×3
BLADE SURG 15 STRL LF DISP TIS (BLADE) ×2 IMPLANT
BLADE SURG 15 STRL SS (BLADE) ×1
CANISTER SUCT 3000ML PPV (MISCELLANEOUS) ×3 IMPLANT
CHLORAPREP W/TINT 26ML (MISCELLANEOUS) ×3 IMPLANT
CLIP APPLIE 5 13 M/L LIGAMAX5 (MISCELLANEOUS) ×2 IMPLANT
CLIP APPLIE 9.375 MED OPEN (MISCELLANEOUS) ×2 IMPLANT
COVER PROBE W GEL 5X96 (DRAPES) ×3 IMPLANT
COVER SURGICAL LIGHT HANDLE (MISCELLANEOUS) ×3 IMPLANT
DERMABOND ADVANCED (GAUZE/BANDAGES/DRESSINGS) ×2
DERMABOND ADVANCED .7 DNX12 (GAUZE/BANDAGES/DRESSINGS) ×4 IMPLANT
DEVICE DUBIN SPECIMEN MAMMOGRA (MISCELLANEOUS) ×3 IMPLANT
DRAPE CHEST BREAST 15X10 FENES (DRAPES) ×3 IMPLANT
DRAPE UNIVERSAL PACK (DRAPES) ×3 IMPLANT
DRAPE UTILITY XL STRL (DRAPES) ×3 IMPLANT
ELECT CAUTERY BLADE 6.4 (BLADE) ×3 IMPLANT
ELECT REM PT RETURN 9FT ADLT (ELECTROSURGICAL) ×3
ELECTRODE REM PT RTRN 9FT ADLT (ELECTROSURGICAL) ×2 IMPLANT
GLOVE BIOGEL PI IND STRL 7.0 (GLOVE) ×2 IMPLANT
GLOVE BIOGEL PI IND STRL 8.5 (GLOVE) ×2 IMPLANT
GLOVE BIOGEL PI INDICATOR 7.0 (GLOVE) ×1
GLOVE BIOGEL PI INDICATOR 8.5 (GLOVE) ×1
GLOVE ECLIPSE 7.0 STRL STRAW (GLOVE) ×3 IMPLANT
GLOVE SURG SIGNA 7.5 PF LTX (GLOVE) ×3 IMPLANT
GOWN STRL REUS W/ TWL LRG LVL3 (GOWN DISPOSABLE) ×2 IMPLANT
GOWN STRL REUS W/ TWL XL LVL3 (GOWN DISPOSABLE) ×4 IMPLANT
GOWN STRL REUS W/TWL LRG LVL3 (GOWN DISPOSABLE) ×1
GOWN STRL REUS W/TWL XL LVL3 (GOWN DISPOSABLE) ×2
KIT BASIN OR (CUSTOM PROCEDURE TRAY) ×3 IMPLANT
KIT MARKER MARGIN INK (KITS) ×3 IMPLANT
KIT ROOM TURNOVER OR (KITS) ×3 IMPLANT
NEEDLE HYPO 25X1 1.5 SAFETY (NEEDLE) ×3 IMPLANT
NS IRRIG 1000ML POUR BTL (IV SOLUTION) ×3 IMPLANT
PACK SURGICAL SETUP 50X90 (CUSTOM PROCEDURE TRAY) ×3 IMPLANT
PAD ARMBOARD 7.5X6 YLW CONV (MISCELLANEOUS) ×3 IMPLANT
PENCIL BUTTON HOLSTER BLD 10FT (ELECTRODE) ×3 IMPLANT
POUCH SPECIMEN RETRIEVAL 10MM (ENDOMECHANICALS) ×3 IMPLANT
SCISSORS LAP 5X35 DISP (ENDOMECHANICALS) ×3 IMPLANT
SET IRRIG TUBING LAPAROSCOPIC (IRRIGATION / IRRIGATOR) ×3 IMPLANT
SLEEVE ENDOPATH XCEL 5M (ENDOMECHANICALS) ×6 IMPLANT
SPECIMEN JAR SMALL (MISCELLANEOUS) ×3 IMPLANT
SPONGE LAP 18X18 X RAY DECT (DISPOSABLE) ×3 IMPLANT
SUT MNCRL AB 4-0 PS2 18 (SUTURE) ×3 IMPLANT
SUT VIC AB 3-0 SH 18 (SUTURE) ×3 IMPLANT
SYR BULB 3OZ (MISCELLANEOUS) ×3 IMPLANT
SYR CONTROL 10ML LL (SYRINGE) ×3 IMPLANT
TOWEL OR 17X24 6PK STRL BLUE (TOWEL DISPOSABLE) ×3 IMPLANT
TOWEL OR 17X26 10 PK STRL BLUE (TOWEL DISPOSABLE) ×3 IMPLANT
TRAY LAPAROSCOPIC MC (CUSTOM PROCEDURE TRAY) ×3 IMPLANT
TROCAR XCEL BLUNT TIP 100MML (ENDOMECHANICALS) ×3 IMPLANT
TROCAR XCEL NON-BLD 5MMX100MML (ENDOMECHANICALS) ×3 IMPLANT
TUBE CONNECTING 12X1/4 (SUCTIONS) ×3 IMPLANT
TUBING INSUFFLATION (TUBING) ×3 IMPLANT
YANKAUER SUCT BULB TIP NO VENT (SUCTIONS) ×3 IMPLANT

## 2017-01-15 NOTE — Interval H&P Note (Signed)
History and Physical Interval Note: no change in H and P  01/15/2017 10:08 AM  Christina [name]  has presented today for surgery, with the diagnosis of right breast intraductal papilloma, symptomatic gallstones  The various methods of treatment have been discussed with the patient and family. After consideration of risks, benefits and other options for treatment, the patient has consented to  Procedure(s): BREAST LUMPECTOMY WITH RADIOACTIVE SEED LOCALIZATION (Right) LAPAROSCOPIC CHOLECYSTECTOMY (N/A) as a surgical intervention .  The patient's history has been reviewed, patient examined, no change in status, stable for surgery.  I have reviewed the patient's chart and labs.  Questions were answered to the patient's satisfaction.     Christina [name] A

## 2017-01-15 NOTE — Anesthesia Procedure Notes (Signed)
Procedure Name: Intubation Date/Time: 01/15/2017 10:45 AM Performed by: Suzy Bouchard Pre-anesthesia Checklist: Patient identified, Emergency Drugs available, Suction available, Patient being monitored and Timeout performed Patient Re-evaluated:Patient Re-evaluated prior to inductionOxygen Delivery Method: Circle system utilized Preoxygenation: Pre-oxygenation with 100% oxygen Intubation Type: IV induction Ventilation: Mask ventilation without difficulty Laryngoscope Size: Miller and 2 Grade View: Grade I Tube type: Oral Tube size: 7.0 mm Number of attempts: 1 Airway Equipment and Method: Stylet Placement Confirmation: ETT inserted through vocal cords under direct vision,  positive ETCO2 and breath sounds checked- equal and bilateral Secured at: 21 cm Tube secured with: Tape Dental Injury: Teeth and Oropharynx as per pre-operative assessment

## 2017-01-15 NOTE — Discharge Instructions (Signed)
[facility] ______[facility] LAPAROSCOPIC SURGERY: POST OP INSTRUCTIONS Always review your discharge instruction sheet given to you by the facility where your surgery was performed. IF YOU HAVE DISABILITY OR FAMILY LEAVE FORMS, YOU MUST BRING THEM TO THE OFFICE FOR PROCESSING.   DO NOT GIVE THEM TO YOUR DOCTOR.  1. A prescription for pain medication may be given to you upon discharge.  Take your pain medication as prescribed, if needed.  If narcotic pain medicine is not needed, then you may take acetaminophen (Tylenol) or ibuprofen (Advil) as needed. 2. Take your usually prescribed medications unless otherwise directed. 3. If you need a refill on your pain medication, please contact your pharmacy.  They will contact our office to request authorization. Prescriptions will not be filled after 5pm or on week-ends. 4. You should follow a light diet the first few days after arrival home, such as soup and crackers, etc.  Be sure to include lots of fluids daily. 5. Most patients will experience some swelling and bruising in the area of the incisions.  Ice packs will help.  Swelling and bruising can take several days to resolve.  6. It is common to experience some constipation if taking pain medication after surgery.  Increasing fluid intake and taking a stool softener (such as Colace) will usually help or prevent this problem from occurring.  A mild laxative (Milk of Magnesia or Miralax) should be taken according to package instructions if there are no bowel movements after 48 hours. 7. Unless discharge instructions indicate otherwise, you may remove your bandages 24-48 hours after surgery, and you may shower at that time.  You may have steri-strips (small skin tapes) in place directly over the incision.  These strips should be left on the skin for 7-10 days.  If your surgeon used skin glue on the incision, you may shower in 24 hours.  The glue will flake off over the next 2-3 weeks.  Any sutures or  staples will be removed at the office during your follow-up visit. 8. ACTIVITIES:  You may resume regular (light) daily activities beginning the next day--such as daily self-care, walking, climbing stairs--gradually increasing activities as tolerated.  You may have sexual intercourse when it is comfortable.  Refrain from any heavy lifting or straining until approved by your doctor. a. You may drive when you are no longer taking prescription pain medication, you can comfortably wear a seatbelt, and you can safely maneuver your car and apply brakes. b. RETURN TO WORK:  __________________________________________________________ 9. You should see your doctor in the office for a follow-up appointment approximately 2-3 weeks after your surgery.  Make sure that you call for this appointment within a day or two after you arrive home to insure a convenient appointment time. 10. OTHER INSTRUCTIONS:ok to shower tomorrow 11. Ice pack also for pain 12. No lifting more than 15 pounds for 2 weeks __________________________________________________________________________________________________________________________ __________________________________________________________________________________________________________________________ WHEN TO CALL YOUR DOCTOR: 1. Fever over 101.0 2. Inability to urinate 3. Continued bleeding from incision. 4. Increased pain, redness, or drainage from the incision. 5. Increasing abdominal pain  The clinic staff is available to answer your questions during regular business hours.  Please dont hesitate to call and ask to speak to one of the nurses for clinical concerns.  If you have a medical emergency, go to the nearest emergency room or call 911.  A surgeon from [facility] is always on call at the hospital. [address] [place], [place], [place]  [address] ?  [id number], [place], [place]   [address] [phone] ? [phone] ? FAX [phone] Web  site: [email or web address] [employer] Phone Number [phone]  BREAST BIOPSY/ PARTIAL MASTECTOMY: POST OP INSTRUCTIONS  Always review your discharge instruction sheet given to you by the facility where your surgery was performed.  IF YOU HAVE DISABILITY OR FAMILY LEAVE FORMS, YOU MUST BRING THEM TO THE OFFICE FOR PROCESSING.  DO NOT GIVE THEM TO YOUR DOCTOR.  1. A prescription for pain medication may be given to you upon discharge.  Take your pain medication as prescribed, if needed.  If narcotic pain medicine is not needed, then you may take acetaminophen (Tylenol) or ibuprofen (Advil) as needed. 2. Take your usually prescribed medications unless otherwise directed 3. If you need a refill on your pain medication, please contact your pharmacy.  They will contact our office to request authorization.  Prescriptions will not be filled after 5pm or on week-ends. 4. You should eat very light the first 24 hours after surgery, such as soup, crackers, pudding, etc.  Resume your normal diet the day after surgery. 5. Most patients will experience some swelling and bruising in the breast.  Ice packs and a good support bra will help.  Swelling and bruising can take several days to resolve.  6. It is common to experience some constipation if taking pain medication after surgery.  Increasing fluid intake and taking a stool softener will usually help or prevent this problem from occurring.  A mild laxative (Milk of Magnesia or Miralax) should be taken according to package directions if there are no bowel movements after 48 hours. 7. Unless discharge instructions indicate otherwise, you may remove your bandages 24-48 hours after surgery, and you may shower at that time.  You may have steri-strips (small skin tapes) in place directly over the incision.  These strips should be left on the skin for 7-10 days.  If your surgeon used skin glue on the incision, you may shower in 24 hours.   The glue will flake off over the next 2-3 weeks.  Any sutures or staples will be removed at the office during your follow-up visit. 8. ACTIVITIES:  You may resume regular daily activities (gradually increasing) beginning the next day.  Wearing a good support bra or sports bra minimizes pain and swelling.  You may have sexual intercourse when it is comfortable. a. You may drive when you no longer are taking prescription pain medication, you can comfortably wear a seatbelt, and you can safely maneuver your car and apply brakes. b. RETURN TO WORK:  ______________________________________________________________________________________ 9. You should see your doctor in the office for a follow-up appointment approximately two weeks after your surgery.  Your doctors nurse will typically make your follow-up appointment when she calls you with your pathology report.  Expect your pathology report 2-3 business days after your surgery.  You may call to check if you do not hear from [place] after three days. 10. OTHER INSTRUCTIONS: _______________________________________________________________________________________________ _____________________________________________________________________________________________________________________________________ _____________________________________________________________________________________________________________________________________ _____________________________________________________________________________________________________________________________________  WHEN TO CALL YOUR DOCTOR: 6. Fever over 101.0 7. Nausea and/or vomiting. 8. Extreme swelling or bruising. 9. Continued bleeding from incision. 10. Increased pain, redness, or drainage from the incision.  The clinic staff is available to answer your questions during regular business hours.  Please dont hesitate to call and ask to speak to one of the nurses for clinical concerns.  If you have a medical  emergency, go to the nearest emergency room or call 911.  A surgeon  from [facility] is always on call at the hospital.  For further questions, please visit [email or web address]

## 2017-01-15 NOTE — Transfer of Care (Signed)
Immediate Anesthesia Transfer of Care Note  Patient: Christina [name]  Procedure(s) Performed: Procedure(s): BREAST LUMPECTOMY WITH RADIOACTIVE SEED LOCALIZATION (Right) LAPAROSCOPIC CHOLECYSTECTOMY (N/A)  Patient Location: PACU  Anesthesia Type:General  Level of Consciousness: awake, alert  and oriented  Airway & Oxygen Therapy: Patient Spontanous Breathing and Patient connected to nasal cannula oxygen  Post-op Assessment: Report given to RN, Post -op Vital signs reviewed and stable and Patient moving all extremities X 4  Post vital signs: Reviewed and stable  Last Vitals:  Vitals:   01/15/17 0934  BP: (!) 171/81  Pulse: 90  Resp: 18  Temp: 36.8 C    Last Pain:  Vitals:   01/15/17 0934  TempSrc: Oral  PainSc: 1       Patients Stated Pain Goal: 3 (57/97/28 2060)  Complications: No apparent anesthesia complications

## 2017-01-15 NOTE — Anesthesia Postprocedure Evaluation (Signed)
Anesthesia Post Note  Patient: Christina [name]  Procedure(s) Performed: Procedure(s) (LRB): BREAST LUMPECTOMY WITH RADIOACTIVE SEED LOCALIZATION (Right) LAPAROSCOPIC CHOLECYSTECTOMY (N/A)  Patient location during evaluation: PACU Anesthesia Type: General Level of consciousness: awake and alert Pain management: pain level controlled Vital Signs Assessment: post-procedure vital signs reviewed and stable Respiratory status: spontaneous breathing, nonlabored ventilation, respiratory function stable and patient connected to nasal cannula oxygen Cardiovascular status: blood pressure returned to baseline and stable Postop Assessment: no signs of nausea or vomiting Anesthetic complications: no       Last Vitals:  Vitals:   01/15/17 1220 01/15/17 1232  BP: (!) 148/83 127/79  Pulse: 90 87  Resp: 18 19  Temp:  36.9 C    Last Pain:  Vitals:   01/15/17 1232  TempSrc:   PainSc: 3                  Tiajuana Amass

## 2017-01-15 NOTE — Op Note (Signed)
BREAST LUMPECTOMY WITH RADIOACTIVE SEED LOCALIZATION, LAPAROSCOPIC CHOLECYSTECTOMY  Procedure Note  Christina Christina [name] 01/15/2017   Pre-op Diagnosis: right breast intraductal papilloma, symptomatic gallstones     Post-op Diagnosis: same  Procedure(s): BREAST LUMPECTOMY WITH RADIOACTIVE SEED LOCALIZATION LAPAROSCOPIC CHOLECYSTECTOMY  Surgeon(s): Coralie Keens, MD  Anesthesia: General  Staff:  Circulator: Harrel Lemon, RN Scrub Person: Adella Hare; Rosanne Sack, RN Circulator Assistant: Rosanne Sack, RN  Estimated Blood Loss: Minimal               Specimens: sent to path  Indications: This is Christina [name] 56 year old female with Christina [name] right breast papilloma as well as symptomatic cholelithiasis. The decision has been made to proceed to the operating room for Christina [name] radioactive seed localized right breast lumpectomy and laparoscopic cholecystectomy  Findings: The radioactive seed and previous marker were found abandoned the biopsy specimen on x-ray postoperatively. The gallbladder had the appearance of chronic cholecystitis with gallstones. There were Christina [name] lot of intra-abdominal lesions in the upper abdomen from her previous ulcer surgery  Procedure: The patient was brought to the operating room and identified as the correct patient. She is placed supine on the operating table and general seizures induced. Her right breast and her abdomen was then prepped and draped in usual sterile fashion. Using the neoprobe, I identified an area of increased uptake in the lateral right breast underneath the areola. I anesthetized the skin with Marcaine and made Christina [name] circumareolar incision with Christina [name] scalpel. I then took this down into the breast tissue with the cautery. With the aid of the neoprobe I then completed the lumpectomy with the electrocautery. The neoprobe confirmed that the radiated seed was in the specimen. I marked all margins marker pain. X-ray was again performed demonstrating that the radioactive  seed and previous marker were in the breast specimen. I achieved hemostasis in the lumpectomy cavity with cautery. I placed several clips for marking purposes into the cavity. I then closed the subcutaneous tissue with interrupted 3-0 Vicryl sutures and closed the skin with Christina [name] running 4-0 Monocryl.  Next, I made Christina [name] small vertical incision below the umbilicus. I carried this down to the fascia which was opened with Christina [name] scalpel. Christina [name] hemostat was then used to pass into the peritoneal cavity under direct vision. Christina [name] 0 Vicryl pursing suture was placed around the fascial opening. The [facility] port was placed the opening and insufflation of the abdomen was begun. The patient had Christina [name] large amount of adhesions to the upper midline as well as above the liver and gallbladder. I was able to place two 5 mm trochars in the right upper quadrant under direct vision. I was unable to lyse the adhesions from the abdominal wall and above the liver as well as part of the midline. I was then able to place Christina [name] 5 mm epigastric trocar and direct vision. I was then able to peel the rest of the omentum off of the gallbladder and identify the gallbladder. The gallbladder was then elevated. The cystic duct was dissected out and Christina [name] critical and was achieved around it. I clipped it 3 times proximal and once distally and transected it. The cystic artery was then identified and clipped proximal and distally and transected as well. The gallbladder was then slowly dissected free from the liver bed with the electrocautery. I then placed the gallbladder in Christina [name] pouch and removed it through the incision at the umbilicus. The 0 Vicryl of the obliquus was tied in place causing the fascial defect.  I then irrigated the abdomen with saline. Hemostasis in the liver bed appeared to be achieved. All ports were then removed under direct vision and the abdomen was deflated. All incisions were then anesthetized with Marcaine and closed the 4-0 Monocryl sutures. Skin glue was then  applied all incisions. The patient tolerated procedure well. All the counts were correct at the end of the procedure. The patient was in Christina [name] stated in the operating room and taken in Christina [name] stable condition to the recovery room.          Christina Christina [name]   Date: 01/15/2017  Time: 11:37 AM

## 2017-01-15 NOTE — Anesthesia Preprocedure Evaluation (Addendum)
Anesthesia Evaluation  Patient identified by MRN, date of birth, ID band Patient awake    Reviewed: Allergy & Precautions, NPO status , Patient's Chart, lab work & pertinent test results  History of Anesthesia Complications (+) PROLONGED EMERGENCE  Airway Mallampati: II  TM Distance: >3 FB Neck ROM: Full    Dental  (+) Dental Advisory Given   Pulmonary asthma , former smoker,    breath sounds clear to auscultation       Cardiovascular negative cardio ROS   Rhythm:Regular Rate:Normal     Neuro/Psych negative neurological ROS     GI/Hepatic Neg liver ROS, PUD, GERD  ,  Endo/Other  diabetes, Type 2, Oral Hypoglycemic Agents  Renal/GU negative Renal ROS     Musculoskeletal  (+) Arthritis ,   Abdominal   Peds  Hematology negative hematology ROS (+)   Anesthesia Other Findings   Reproductive/Obstetrics                           Lab Results  Component Value Date   WBC 10.0 01/12/2017   HGB 13.8 01/12/2017   HCT 40.2 01/12/2017   MCV 86.3 01/12/2017   PLT 289 01/12/2017   Lab Results  Component Value Date   CREATININE 0.81 01/12/2017   BUN 7 01/12/2017   NA 139 01/12/2017   K 3.8 01/12/2017   CL 107 01/12/2017   CO2 23 01/12/2017    Anesthesia Physical Anesthesia Plan  ASA: II  Anesthesia Plan: General   Post-op Pain Management:    Induction: Intravenous  Airway Management Planned: LMA  Additional Equipment:   Intra-op Plan:   Post-operative Plan: Extubation in OR  Informed Consent: I have reviewed the patients History and Physical, chart, labs and discussed the procedure including the risks, benefits and alternatives for the proposed anesthesia with the patient or authorized representative who has indicated his/her understanding and acceptance.   Dental advisory given  Plan Discussed with: CRNA  Anesthesia Plan Comments:         Anesthesia Quick  Evaluation

## 2017-01-16 ENCOUNTER — Encounter (HOSPITAL_COMMUNITY): Payer: Self-pay | Admitting: Surgery

## 2017-02-02 ENCOUNTER — Other Ambulatory Visit: Payer: Self-pay | Admitting: Family Medicine

## 2017-02-17 ENCOUNTER — Ambulatory Visit: Payer: BLUE CROSS/BLUE SHIELD | Admitting: Allergy & Immunology

## 2017-04-15 ENCOUNTER — Ambulatory Visit (INDEPENDENT_AMBULATORY_CARE_PROVIDER_SITE_OTHER): Payer: BLUE CROSS/BLUE SHIELD | Admitting: Family Medicine

## 2017-04-15 ENCOUNTER — Encounter: Payer: Self-pay | Admitting: Family Medicine

## 2017-04-15 ENCOUNTER — Telehealth: Payer: Self-pay | Admitting: Family Medicine

## 2017-04-15 VITALS — BP 136/88 | HR 92 | Temp 97.8°F | Resp 20 | Ht 65.5 in | Wt 216.0 lb

## 2017-04-15 DIAGNOSIS — M7989 Other specified soft tissue disorders: Secondary | ICD-10-CM

## 2017-04-15 DIAGNOSIS — M79604 Pain in right leg: Secondary | ICD-10-CM

## 2017-04-15 MED ORDER — OXYCODONE-ACETAMINOPHEN 5-325 MG PO TABS: 1.0000 | ORAL_TABLET | Freq: Three times a day (TID) | ORAL | 0 refills | Status: DC | PRN

## 2017-04-15 NOTE — Telephone Encounter (Signed)
Closing encounter

## 2017-04-15 NOTE — Progress Notes (Signed)
   Subjective:    Patient ID: Christina [name], female    DOB: 1961/09/18, 56 y.o.   MRN: [id number]  Patient presents for Right knee/leg  pain   Pt here with right Leg pain. This been going on for about a week. She missed she has had 2 surgeries in the past couple months she had a lumpectomy on her breast for a papilloma and she also had her gallbladder removed in April. She's not had any travels not on any hormone therapy. She noticed swelling around her knee starts to extend around her calf area. About 3 or 4 days ago she noted bruising on her cath and then last night she felt a knot. She states the  pain is severe andshe cannot sleep or walk on the leg without pain. She does have history of degenerative disc disease with some nerve impingement states that she is a pain clinic in the past and her back is also been more painful since her leg swelled up. She is not on any chronic pain medications currently.   Review Of Systems:  GEN- denies fatigue, fever, weight loss,weakness, recent illness HEENT- denies eye drainage, change in vision, nasal discharge, CVS- denies chest pain, palpitations RESP- denies SOB, cough, wheeze ABD- denies N/V, change in stools, abd pain GU- denies dysuria, hematuria, dribbling, incontinence MSK- denies joint pain, muscle aches, injury Neuro- denies headache, dizziness, syncope, seizure activity       Objective:    BP 136/88   Pulse 92   Temp 97.8 F (36.6 C) (Oral)   Resp 20   Ht 5' 5.5" (1.664 m)   Wt 216 lb (98 kg)   BMI 35.40 kg/m  GEN- NAD, alert and oriented x3 EXT- Right leg- swelling extending from above knee to below calf, TTP along entire area, Right calf 19iches, left 18inches , bruising 2 x1 " area with hard nodule in center, + homans Mild swelling at akle right side Pain with extension of leg.  MSK- Spine Mild TTP, pain with all ROM (in leg) Pulses- Radial, DP- 2+        Assessment & Plan:      Problem List Items Addressed This  Visit    None    Visit Diagnoses    Calf swelling    -  Primary   Concern for DVT, she has signs of superficial clot as well with the bruising and nodule, obtain urgent US leg. Given Oxycodone acetaminophen. Also given Eliquis- unforunately unable to get [place] until tomorrow morning, so will  iF This is a clot, plan to start 10mg  BID for 1 week, then 5mg  BID for at least 3 months.    Relevant Orders   VAS [place] LOWER EXTREMITY VENOUS (DVT)   Pain of right lower extremity       Relevant Orders   VAS [place] LOWER EXTREMITY VENOUS (DVT)      Note: This dictation was prepared with Dragon dictation along with smaller phrase technology. Any transcriptional errors that result from this process are unintentional.

## 2017-04-15 NOTE — Patient Instructions (Addendum)
Get the ultrasound Take the pain medication as needed  Take the eliquis 10mg  Tonight ( 2 of the 5mg  tablets) - This is a blood thinner to treat blood clot, we need to rule this out before giving any other medications such as prednisone. [place] at [employer] F/U pendind results

## 2017-04-16 ENCOUNTER — Ambulatory Visit (HOSPITAL_COMMUNITY)
Admission: RE | Admit: 2017-04-16 | Discharge: 2017-04-16 | Disposition: A | Discharge status: Home / Self Care | Payer: BLUE CROSS/BLUE SHIELD | Source: Ambulatory Visit | Attending: Family Medicine | Admitting: Family Medicine

## 2017-04-16 ENCOUNTER — Telehealth: Payer: Self-pay | Admitting: *Deleted

## 2017-04-16 DIAGNOSIS — M79604 Pain in right leg: Secondary | ICD-10-CM | POA: Insufficient documentation

## 2017-04-16 DIAGNOSIS — M7989 Other specified soft tissue disorders: Secondary | ICD-10-CM

## 2017-04-16 MED ORDER — METHYLPREDNISOLONE 4 MG PO TBPK: ORAL_TABLET | ORAL | 0 refills | Status: DC

## 2017-04-16 NOTE — Telephone Encounter (Signed)
Received call from [place] from [facility].   States that patient [place] I negative. Shows no DVT, no Baker's Cyst.   Patient advised that she can go home to await further instructions. Patient does report that pain has not improved.   MD please advise.

## 2017-04-16 NOTE — Telephone Encounter (Signed)
Tell pt we will review the entire Ultrasound once report comes in, but no sign of Deep Vein Clot So she does not need anymore Eliquis Ice and elevate her calf, I think she does have a small superficial clot where the knot is and the bruising, ICE and elevation should help this. If the ICE causes too much pain she can try JUST COOL compress. Since she cant take anti-inflammatories due to her ulcer history  Send over Medrol dosepak (steroid) and take the pain medication F/U for recheck in office Monday or Tuesday

## 2017-04-16 NOTE — Progress Notes (Signed)
VASCULAR LAB PRELIMINARY  PRELIMINARY  PRELIMINARY  PRELIMINARY  Right lower extremity venous duplex completed.    Preliminary report:  Right:  No evidence of DVT, superficial thrombosis, or Baker's cyst.  Lavonn Maxcy, RVS 04/16/2017, 10:09 AM

## 2017-04-16 NOTE — Telephone Encounter (Signed)
Call placed to patient and patient made aware.   Prescription sent to pharmacy.   Appointment scheduled.  

## 2017-04-20 ENCOUNTER — Encounter: Payer: Self-pay | Admitting: Family Medicine

## 2017-04-20 ENCOUNTER — Ambulatory Visit: Payer: BLUE CROSS/BLUE SHIELD | Admitting: Family Medicine

## 2017-04-20 ENCOUNTER — Ambulatory Visit (INDEPENDENT_AMBULATORY_CARE_PROVIDER_SITE_OTHER): Payer: BLUE CROSS/BLUE SHIELD | Admitting: Family Medicine

## 2017-04-20 VITALS — BP 142/88 | HR 90 | Temp 98.3°F | Resp 14 | Ht 65.5 in | Wt 216.0 lb

## 2017-04-20 DIAGNOSIS — I82811 Embolism and thrombosis of superficial veins of right lower extremities: Secondary | ICD-10-CM | POA: Diagnosis not present

## 2017-04-20 DIAGNOSIS — M5410 Radiculopathy, site unspecified: Secondary | ICD-10-CM | POA: Diagnosis not present

## 2017-04-20 DIAGNOSIS — M5136 Other intervertebral disc degeneration, lumbar region: Secondary | ICD-10-CM | POA: Diagnosis not present

## 2017-04-20 MED ORDER — PREDNISONE 20 MG PO TABS: ORAL_TABLET | ORAL | 0 refills | Status: DC

## 2017-04-20 MED ORDER — METHYLPREDNISOLONE ACETATE 40 MG/ML IJ SUSP
40.0000 mg | Freq: Once | INTRAMUSCULAR | Status: AC
  Administered 2017-04-20: 40 mg via INTRAMUSCULAR

## 2017-04-20 NOTE — Assessment & Plan Note (Signed)
I think she may have some chronic back pain and the move may have exacerbated things, though does not quite explain the severe pain in her leg. Not quite sciatica what she is describing. Her anxiety and pain out of proportion to exam make it very difficult to discern what is going on. No red flags noted I will obtain records from her previous physicians and pain doctor I will give her depo medrol in office, and prednisone taper, as she feels this steroid works better. No further pain meds given.  For the superficial clot, this already improved, I think this was due to trauma to the calf

## 2017-04-20 NOTE — Progress Notes (Signed)
Subjective:    Patient ID: Christina [name], female    DOB: [birth year], 56 y.o.   MRN: [id number]  Patient presents for Calf Pain (patient is taking Medrol Dose Pack, but thinks she needs more steroids- has been taking pain meds more frequently )  Pt here for F/U seen last week, was in "severe" pain in her left/calf, had bruising and superficial clot. Venous US was done and was negative for DVT or bakers cyst. Also had back pain but very hard to distinguish where her pain was originating from. Given norco and medrol dosepak. States she felt great the next day, but then as the steroids wore off, pain return severe in her back and leg. Bruise on calf and knot is much smaller.  She has DDD and bulging disc, sciatica issues, was in pain clinic with a Dr. Dwyane Dee in [place]. Came off meds whens he lost insurance and moved  She has been moving but did not think she injured anything     Review Of Systems:  GEN- denies fatigue, fever, weight loss,weakness, recent illness HEENT- denies eye drainage, change in vision, nasal discharge, CVS- denies chest pain, palpitations RESP- denies SOB, cough, wheeze ABD- denies N/V, change in stools, abd pain GU- denies dysuria, hematuria, dribbling, incontinence MSK- +joint pain, muscle aches, injury Neuro- denies headache, dizziness, syncope, seizure activity       Objective:    BP (!) 142/88   Pulse 90   Temp 98.3 F (36.8 C) (Oral)   Resp 14   Ht 5' 5.5" (1.664 m)   Wt 216 lb (98 kg)   SpO2 98%   BMI 35.40 kg/m  GEN- NAD, alert and oriented x3, noted walked down hall, with little limp, no grimace, in exam room was in severe pain MSK- TTP lumbar spine, + SLR Right side, ? Pain respone ,TTP along entire right leg, calf. Area of bruising 60% smaller and smaller superficial nodule in center of bruise Mild varicose veins No edema at ankles         Assessment & Plan:      Problem List Items Addressed This Visit    DDD (degenerative disc  disease), lumbar    I think she may have some chronic back pain and the move may have exacerbated things, though does not quite explain the severe pain in her leg. Not quite sciatica what she is describing. Her anxiety and pain out of proportion to exam make it very difficult to discern what is going on. No red flags noted I will obtain records from her previous physicians and pain doctor I will give her depo medrol in office, and prednisone taper, as she feels this steroid works better. No further pain meds given.  For the superficial clot, this already improved, I think this was due to trauma to the calf      Relevant Medications   predniSONE (DELTASONE) 20 MG tablet   methylPREDNISolone acetate (DEPO-MEDROL) injection 40 mg (Completed)    Other Visit Diagnoses    Back pain with right-sided radiculopathy    -  Primary   Relevant Medications   predniSONE (DELTASONE) 20 MG tablet   methylPREDNISolone acetate (DEPO-MEDROL) injection 40 mg (Completed)   Superficial thrombosis of leg, right       Relevant Medications   methylPREDNISolone acetate (DEPO-MEDROL) injection 40 mg (Completed)      Note: This dictation was prepared with Dragon dictation along with smaller phrase technology. Any transcriptional errors that result from this process  are unintentional.

## 2017-04-20 NOTE — Patient Instructions (Addendum)
[phone] Release of records- Dr. Oleh Genin Take prednisone as prescribed F/U as needed with Dr. Dennard Schaumann

## 2017-05-06 ENCOUNTER — Encounter: Payer: Self-pay | Admitting: Family Medicine

## 2017-05-06 DIAGNOSIS — M5136 Other intervertebral disc degeneration, lumbar region: Secondary | ICD-10-CM | POA: Insufficient documentation

## 2017-05-06 DIAGNOSIS — M47816 Spondylosis without myelopathy or radiculopathy, lumbar region: Secondary | ICD-10-CM | POA: Insufficient documentation

## 2017-05-12 ENCOUNTER — Ambulatory Visit (INDEPENDENT_AMBULATORY_CARE_PROVIDER_SITE_OTHER): Payer: BLUE CROSS/BLUE SHIELD | Admitting: Family Medicine

## 2017-05-12 ENCOUNTER — Encounter: Payer: Self-pay | Admitting: Family Medicine

## 2017-05-12 VITALS — BP 154/98 | HR 94 | Temp 98.1°F | Resp 20 | Ht 65.5 in | Wt 212.0 lb

## 2017-05-12 DIAGNOSIS — M5136 Other intervertebral disc degeneration, lumbar region: Secondary | ICD-10-CM | POA: Diagnosis not present

## 2017-05-12 DIAGNOSIS — M51369 Other intervertebral disc degeneration, lumbar region without mention of lumbar back pain or lower extremity pain: Secondary | ICD-10-CM

## 2017-05-12 DIAGNOSIS — M79604 Pain in right leg: Secondary | ICD-10-CM | POA: Diagnosis not present

## 2017-05-12 MED ORDER — CYCLOBENZAPRINE HCL 10 MG PO TABS: 10.0000 mg | ORAL_TABLET | Freq: Three times a day (TID) | ORAL | 0 refills | Status: DC | PRN

## 2017-05-12 MED ORDER — OXYCODONE-ACETAMINOPHEN 5-325 MG PO TABS: 1.0000 | ORAL_TABLET | Freq: Three times a day (TID) | ORAL | 0 refills | Status: DC | PRN

## 2017-05-12 NOTE — Progress Notes (Signed)
Subjective:    Patient ID: Christina [name], female    DOB: 1961-10-22, 56 y.o.   MRN: [id number]  HPI  Patient has been seen previously by my partner. She is in tears when I enter the room. She states that she was having severe pain in her lower back. Pain is located at the level of L4-L5. She states that is radiating into her right leg with burning and numbness and tingling. It radiates past her right knee and into her right foot. Previously she states that she's had degenerative disc disease with left-sided sciatica. However this pain is different and more intense. She was previously seeing a pain clinic in [place] where she was diagnosed with degenerative disc disease and was treated with pain medication as she was told she was not a surgical candidate. However the pain in her right knee is out of proportion to exam. She jumps when I touch her medial joint line. She cries and yells when I flex her knee past 90. There is a mild effusion in the knee joint. This would be unlike lumbar radiculopathy. She also winces in pain when I palpate the spinous process of L5. She asked me to stop touching her back when I touch the paraspinal muscles on either side. Again, pain is out of proportion to exam findings. Past Medical History:  Diagnosis Date  . Allergy   . Arthritis   . Asthma   . DDD (degenerative disc disease), lumbar   . Diabetes mellitus without complication ([place])    Type II  . GERD (gastroesophageal reflux disease)   . H/O seasonal allergies   . Perforated ulcer ([place])    due to ibuprofen  . Ulcer    perforated   Past Surgical History:  Procedure Laterality Date  . BREAST LUMPECTOMY WITH RADIOACTIVE SEED LOCALIZATION Right 01/15/2017   Procedure: BREAST LUMPECTOMY WITH RADIOACTIVE SEED LOCALIZATION;  Surgeon: Coralie Keens, MD;  Location: [facility];  Service: General;  Laterality: Right;  . BUNIONECTOMY Right   . CHOLECYSTECTOMY N/A 01/15/2017   Procedure: LAPAROSCOPIC CHOLECYSTECTOMY;   Surgeon: Coralie Keens, MD;  Location: [facility];  Service: General;  Laterality: N/A;  . COLONOSCOPY    . REPAIR OF PERFORATED ULCER  1997  . umbicial hernia     as a baby   Current Outpatient Prescriptions on File Prior to Visit  Medication Sig Dispense Refill  . acetaminophen (TYLENOL) 500 MG tablet Take 1,000 mg by mouth every 6 (six) hours as needed for mild pain.    . Artificial Tear Solution (GENTEAL TEARS OP) Apply 1 drop to eye 2 (two) times daily as needed (irritation).    . cetirizine (ZYRTEC) 10 MG tablet Take 10 mg by mouth daily as needed for allergies.    . Cholecalciferol (VITAMIN D-3) 5000 units TABS Take 5,000 Units by mouth daily.     . fluticasone (FLONASE) 50 MCG/ACT nasal spray Place 2 sprays into both nostrils daily. 16 g 6  . ibuprofen (ADVIL,MOTRIN) 200 MG tablet Take 400 mg by mouth every 6 (six) hours as needed for headache or mild pain.    . Liniments (SALONPAS EX) Apply 1 patch topically daily as needed (pain).    Marland Kitchen loratadine (CLARITIN) 10 MG tablet Take 10 mg by mouth daily as needed for allergies.     . metFORMIN (GLUCOPHAGE) 1000 MG tablet TAKE ONE TABLET BY MOUTH TWICE DAILY WITH  A  MEAL. 60 tablet 2  . tetrahydrozoline-zinc (VISINE-AC) 0.05-0.25 % ophthalmic solution Apply  2 drops to eye 3 (three) times daily as needed (dry eyes).     No current facility-administered medications on file prior to visit.    Allergies  Allergen Reactions  . Influenza Vaccines Other (See Comments)    Seizures and shortness of breath  . Ibuprofen     UNSPECIFIED REACTION   . Egg Donia Pounds, Egg] Other (See Comments)    Sneezing    Social History   Social History  . Marital status: Married    Spouse name: N/A  . Number of children: N/A  . Years of education: N/A   Occupational History  . Not on file.   Social History Main Topics  . Smoking status: Former Smoker    Years: 25.00  . Smokeless tobacco: Never Used     Comment: 01/12/17-quit - 25 years  . Alcohol  use No  . Drug use: No  . Sexual activity: Yes   Other Topics Concern  . Not on file   Social History Narrative  . No narrative on file     Review of Systems  All other systems reviewed and are negative.      Objective:   Physical Exam  Constitutional: She appears distressed.  Cardiovascular: Normal rate, regular rhythm and normal heart sounds.   Pulmonary/Chest: Effort normal and breath sounds normal.  Musculoskeletal:       Right knee: She exhibits decreased range of motion and effusion. She exhibits no ecchymosis, no deformity, no erythema and normal alignment. Tenderness found. Medial joint line tenderness noted.       Lumbar back: She exhibits decreased range of motion, tenderness, bony tenderness and pain. She exhibits no swelling, no edema and no deformity.       Back:          Assessment & Plan:  Right leg pain - Plan: DG Knee Complete 4 Views Right, cyclobenzaprine (FLEXERIL) 10 MG tablet  DDD (degenerative disc disease), lumbar - Plan: DG Lumbar Spine Complete, cyclobenzaprine (FLEXERIL) 10 MG tablet  Must begin some type of workup. Start with an x-ray of the lumbar spine to evaluate the severity of her degenerative disc disease. Would likely need an MRI of the lumbar spine if pain persists to determine if she is a candidate for corticosteroid injections versus surgery. However her knee pain is beyond that what I would expect from referred pain due to a pinched nerve in her back. 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 Begin by obtaining an x-ray of the right knee.

## 2017-05-14 ENCOUNTER — Ambulatory Visit
Admission: RE | Admit: 2017-05-14 | Discharge: 2017-05-14 | Disposition: A | Discharge status: Home / Self Care | Payer: BLUE CROSS/BLUE SHIELD | Source: Ambulatory Visit | Attending: Family Medicine | Admitting: Family Medicine

## 2017-05-14 ENCOUNTER — Other Ambulatory Visit: Payer: Self-pay | Admitting: Family Medicine

## 2017-05-14 DIAGNOSIS — M5136 Other intervertebral disc degeneration, lumbar region: Secondary | ICD-10-CM

## 2017-05-14 DIAGNOSIS — M79604 Pain in right leg: Secondary | ICD-10-CM

## 2017-05-15 ENCOUNTER — Other Ambulatory Visit: Payer: Self-pay | Admitting: Family Medicine

## 2017-05-15 ENCOUNTER — Encounter: Payer: Self-pay | Admitting: Family Medicine

## 2017-05-15 DIAGNOSIS — M5410 Radiculopathy, site unspecified: Secondary | ICD-10-CM

## 2017-05-25 ENCOUNTER — Ambulatory Visit (INDEPENDENT_AMBULATORY_CARE_PROVIDER_SITE_OTHER): Payer: BLUE CROSS/BLUE SHIELD | Admitting: Family Medicine

## 2017-05-25 VITALS — BP 120/80 | HR 80 | Temp 98.0°F

## 2017-05-25 DIAGNOSIS — M79604 Pain in right leg: Secondary | ICD-10-CM | POA: Diagnosis not present

## 2017-05-25 NOTE — Progress Notes (Signed)
Subjective:    Patient ID: Christina [name], female    DOB: 02-Aug-1961, 56 y.o.   MRN: [id number]  HPI 05/12/17 Patient has been seen previously by my partner. She is in tears when I enter the room. She states that she was having severe pain in her lower back. Pain is located at the level of L4-L5. She states that is radiating into her right leg with burning and numbness and tingling. It radiates past her right knee and into her right foot. Previously she states that she's had degenerative disc disease with left-sided sciatica. However this pain is different and more intense. She was previously seeing a pain clinic in [place] where she was diagnosed with degenerative disc disease and was treated with pain medication as she was told she was not a surgical candidate. However the pain in her right knee is out of proportion to exam. She jumps when I touch her medial joint line. She cries and yells when I flex her knee past 90. There is a mild effusion in the knee joint. This would be unlike lumbar radiculopathy. She also winces in pain when I palpate the spinous process of L5. She asked me to stop touching her back when I touch the paraspinal muscles on either side. Again, pain is out of proportion to exam findings.  At that time, my plan was: Must begin some type of workup. Start with an x-ray of the lumbar spine to evaluate the severity of her degenerative disc disease. Would likely need an MRI of the lumbar spine if pain persists to determine if she is a candidate for corticosteroid injections versus surgery. However her knee pain is beyond that what I would expect from referred pain due to a pinched nerve in her back. 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 Begin by obtaining an x-ray of the right knee.  05/25/17 X-ray of the knee shows medial compartment arthritis. There are degenerative disc changes at L5-S1 but I believe the majority of the patient's pain in her  right knee over the medial compartment is secondary to the arthritis. She is here today for cortisone injection in her knee. I have recommended an MRI of the lumbar spine and that is still yet to be scheduled Past Medical History:  Diagnosis Date  . Allergy   . Arthritis   . Asthma   . DDD (degenerative disc disease), lumbar   . Diabetes mellitus without complication ([place])    Type II  . GERD (gastroesophageal reflux disease)   . H/O seasonal allergies   . Perforated ulcer ([place])    due to ibuprofen  . Ulcer    perforated   Past Surgical History:  Procedure Laterality Date  . BREAST LUMPECTOMY WITH RADIOACTIVE SEED LOCALIZATION Right 01/15/2017   Procedure: BREAST LUMPECTOMY WITH RADIOACTIVE SEED LOCALIZATION;  Surgeon: Coralie Keens, MD;  Location: [facility];  Service: General;  Laterality: Right;  . BUNIONECTOMY Right   . CHOLECYSTECTOMY N/A 01/15/2017   Procedure: LAPAROSCOPIC CHOLECYSTECTOMY;  Surgeon: Coralie Keens, MD;  Location: [facility];  Service: General;  Laterality: N/A;  . COLONOSCOPY    . REPAIR OF PERFORATED ULCER  1997  . umbicial hernia     as a baby   Current Outpatient Prescriptions on File Prior to Visit  Medication Sig Dispense Refill  . acetaminophen (TYLENOL) 500 MG tablet Take 1,000 mg by mouth every 6 (six) hours as needed for mild pain.    . Artificial Tear  Solution (GENTEAL TEARS OP) Apply 1 drop to eye 2 (two) times daily as needed (irritation).    . cetirizine (ZYRTEC) 10 MG tablet Take 10 mg by mouth daily as needed for allergies.    . Cholecalciferol (VITAMIN D-3) 5000 units TABS Take 5,000 Units by mouth daily.     . cyclobenzaprine (FLEXERIL) 10 MG tablet Take 1 tablet (10 mg total) by mouth 3 (three) times daily as needed for muscle spasms. 30 tablet 0  . fluticasone (FLONASE) 50 MCG/ACT nasal spray Place 2 sprays into both nostrils daily. 16 g 6  . ibuprofen (ADVIL,MOTRIN) 200 MG tablet Take 400 mg by mouth every 6 (six) hours as needed for headache or  mild pain.    . Liniments (SALONPAS EX) Apply 1 patch topically daily as needed (pain).    Marland Kitchen loratadine (CLARITIN) 10 MG tablet Take 10 mg by mouth daily as needed for allergies.     . metFORMIN (GLUCOPHAGE) 1000 MG tablet TAKE ONE TABLET BY MOUTH TWICE DAILY WITH  A  MEAL. 60 tablet 2  . metFORMIN (GLUCOPHAGE) 1000 MG tablet TAKE ONE TABLET BY MOUTH TWICE DAILY WITH A MEAL 60 tablet 2  . oxyCODONE-acetaminophen (ROXICET) 5-325 MG tablet Take 1 tablet by mouth every 8 (eight) hours as needed for severe pain. 30 tablet 0  . tetrahydrozoline-zinc (VISINE-AC) 0.05-0.25 % ophthalmic solution Apply 2 drops to eye 3 (three) times daily as needed (dry eyes).     No current facility-administered medications on file prior to visit.    Allergies  Allergen Reactions  . Influenza Vaccines Other (See Comments)    Seizures and shortness of breath  . Ibuprofen     UNSPECIFIED REACTION   . Egg Donia Pounds, Egg] Other (See Comments)    Sneezing    Social History   Social History  . Marital status: Married    Spouse name: N/A  . Number of children: N/A  . Years of education: N/A   Occupational History  . Not on file.   Social History Main Topics  . Smoking status: Former Smoker    Years: 25.00  . Smokeless tobacco: Never Used     Comment: 01/12/17-quit - 25 years  . Alcohol use No  . Drug use: No  . Sexual activity: Yes   Other Topics Concern  . Not on file   Social History Narrative  . No narrative on file     Review of Systems  All other systems reviewed and are negative.      Objective:   Physical Exam  Musculoskeletal:       Right knee: She exhibits decreased range of motion and effusion. She exhibits no ecchymosis, no deformity, no erythema and normal alignment. Tenderness found. Medial joint line tenderness noted.       Lumbar back: She exhibits decreased range of motion, tenderness, bony tenderness and pain. She exhibits no swelling, no edema and no deformity.        Back:          Assessment & Plan:  Pain of right lower extremity  While the patient is having low back pain and radicular symptoms into the right and left leg, I believe the majority of the pain in her knee is secondary to osteoarthritis in the medial compartment. Therefore using sterile technique, I injected the right knee with 2 mL of lidocaine, 2 mL of Marcaine, and 2 mL of 40 mg per mL Kenalog. The patient tolerated the procedure well with no  complication. Await the results of MRI of the back

## 2017-05-27 ENCOUNTER — Ambulatory Visit
Admission: RE | Admit: 2017-05-27 | Discharge: 2017-05-27 | Disposition: A | Discharge status: Home / Self Care | Payer: BLUE CROSS/BLUE SHIELD | Source: Ambulatory Visit | Attending: Family Medicine | Admitting: Family Medicine

## 2017-05-27 DIAGNOSIS — M5410 Radiculopathy, site unspecified: Secondary | ICD-10-CM

## 2017-05-29 ENCOUNTER — Other Ambulatory Visit: Payer: Self-pay | Admitting: Family Medicine

## 2017-05-29 DIAGNOSIS — G8929 Other chronic pain: Secondary | ICD-10-CM

## 2017-05-29 DIAGNOSIS — M549 Dorsalgia, unspecified: Principal | ICD-10-CM

## 2017-06-02 ENCOUNTER — Other Ambulatory Visit: Payer: BLUE CROSS/BLUE SHIELD

## 2017-06-30 ENCOUNTER — Telehealth: Payer: Self-pay | Admitting: Family Medicine

## 2017-06-30 DIAGNOSIS — M545 Low back pain: Principal | ICD-10-CM

## 2017-06-30 DIAGNOSIS — G8929 Other chronic pain: Secondary | ICD-10-CM

## 2017-06-30 NOTE — Telephone Encounter (Signed)
Pt calling to follow up on referral to Dr Nelva Bush.  Notes from [place] provider were sent to him on 06/12/17.  They said he declined referral and they had called pt on 06/17/17 and made her aware.  She denies this.  Wants to know why she was denied.  I told they do not tell me why just that they have.  She said she hopes that they don't think she just a pain seeker but she is in excruciating pain and needs to have this addressed.  She does not want to live on pain medication.  States she has had ESI in past but says didn't help much.  Now her knee is painful.  Some days she can hardly walk!!  Will start new referral, will try Dr [facility].

## 2017-06-30 NOTE — Telephone Encounter (Signed)
agreed

## 2017-08-03 ENCOUNTER — Encounter: Payer: Self-pay | Admitting: Family Medicine

## 2017-08-13 ENCOUNTER — Other Ambulatory Visit: Payer: Self-pay | Admitting: Family Medicine

## 2017-08-13 NOTE — Telephone Encounter (Signed)
Medication refill for one time only.  Patient needs to be seen.  Letter sent for patient to call and schedule 

## 2017-09-10 ENCOUNTER — Encounter: Payer: Self-pay | Admitting: Family Medicine

## 2017-09-10 ENCOUNTER — Ambulatory Visit: Payer: BLUE CROSS/BLUE SHIELD | Admitting: Family Medicine

## 2017-09-11 ENCOUNTER — Ambulatory Visit (INDEPENDENT_AMBULATORY_CARE_PROVIDER_SITE_OTHER): Payer: BLUE CROSS/BLUE SHIELD | Admitting: Family Medicine

## 2017-09-11 ENCOUNTER — Encounter: Payer: Self-pay | Admitting: Family Medicine

## 2017-09-11 VITALS — BP 136/100 | HR 104 | Temp 98.1°F | Resp 18 | Ht 65.5 in | Wt 217.0 lb

## 2017-09-11 DIAGNOSIS — Z23 Encounter for immunization: Secondary | ICD-10-CM

## 2017-09-11 DIAGNOSIS — I10 Essential (primary) hypertension: Secondary | ICD-10-CM

## 2017-09-11 DIAGNOSIS — E119 Type 2 diabetes mellitus without complications: Secondary | ICD-10-CM

## 2017-09-11 NOTE — Progress Notes (Signed)
Subjective:    Patient ID: Christina [name], female    DOB: Nov 18, 1960, 56 y.o.   MRN: [id number]  HPI 2/17 Patient is here today to establish care. She was also scheduled for a physical exam. However in the interval time she is become very concerned because it appears she is a diabetic. She recently had a colonoscopy at Dr. Lorie Apley office. Her random blood sugar was found to be 290. They repeated another random blood sugar and found to be 281. Both of which would be consistent with diabetes. The patient has been checking fasting blood sugars every morning and finding it ranging between 2020-221. Therefore I anticipateher hemoglobin A1c will be between 9.5 and 10.5. She is here today with numerous questions regarding this and how to manage her diabetes. Colonoscopy was normal. She is also recently had an eye exam and a mammogram which were normal (per her report).  At that time, my plan was: I was unable to perform a complete physical exam as I spent more than 45 minutes answering the patient's questions. She will start metformin and gradually increased to 1000 mg by mouth twice a day. Also discussed at length a diabetic low carb diet trying to reduce her carb intake to less than 45 g per meal. Also recommended 30 minutes a day of aerobic exercise. I will check a CBC, CMP, fasting lipid panel, urine microalbumin, and a vitamin D level. I will have the patient check fasting blood sugars and two-hour postprandial sugars and then recheck here in one month. If hemoglobin A1c is significantly elevated, we may also need to supplement the metformin with jardiance.  02/01/16 Patient is currently on metformin 1000 mg by mouth twice a day. Her fasting blood sugars are between 130 and 145. Her two-hour postprandial sugars are between 120 and 150. I am presently surprised. Her sugars are very consistent. She seems to be doing very well and denies any side effects on the metformin.  At that time, my plan was: I  recommended continuing metformin at the present time and rechecking hemoglobin A1c along with a fasting lipid panel in 3 months. At that time we will check a urine microalbumin and if elevated we'll add an ACE inhibitor. I recommended 10-15 pounds weight loss and 30 minutes of aerobic exercise 5 days a week. If hemoglobin A1c is not less than 6.5 at her next visit, I will add jardiance.  05/15/16 Here today for follow up.  Overall she is doing very well. She is tolerating metformin without difficulty. She denies any side effects from medication. Diabetic eye exam is up-to-date. She is not taking aspirin due to her history of a perforated ulcer. She denies any neuropathy. She denies any chest pain shortness of breath or dyspnea on exertion. She denies any hypoglycemia. Hemoglobin A1c has dropped dramatically. Unfortunately she is not exercising.  At that time, my plan was: Diabetes is now controlled. Her blood pressure is excellent. Her LDL cholesterol is excellent when checked last spring. I have recommended that she start aerobic exercise 30 minutes a day 5 days a week. Monitor for declining blood sugars as we may need to decrease her metformin dose if she starts exercising and losing weight. I will check a urine microalbumin and if elevated, I will start the patient on an ACE inhibitor. The remainder of her preventative care is up-to-date.  09/11/17 Patient is here today at my request because she is long overdue for a diabetes check.  Her blood pressure  is elevated today but she attributes this to anxiety.  She denies any chest pain shortness of breath or dyspnea on exertion.  She continues to discuss her low back pain and her chronic right knee pain.  X-ray of the right knee reveals mild medial compartment arthritis.  The patient is interested in trying a knee brace to help alleviate her pain.  She canceled her previous appointment with a neurosurgeon to discuss her back.  She states that her back is doing  much better.  Regarding her blood sugars, she is not checking them regularly but her most recent hemoglobin A1c was 6.2 in March.  She denies any polyuria, polydipsia, or blurry vision.  She denies any neuropathy in the feet and her diabetic foot exam is performed today and is normal.  She is due for Pneumovax 23.  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  She has had a diabetic eye exam although we have no record of this  Past Medical History:  Diagnosis Date  . Allergy   . Arthritis   . Asthma   . DDD (degenerative disc disease), lumbar   . Diabetes mellitus without complication ([place])    Type II  . GERD (gastroesophageal reflux disease)   . H/O seasonal allergies   . Perforated ulcer ([place])    due to ibuprofen  . Ulcer    perforated   Past Surgical History:  Procedure Laterality Date  . BREAST LUMPECTOMY WITH RADIOACTIVE SEED LOCALIZATION Right 01/15/2017   Procedure: BREAST LUMPECTOMY WITH RADIOACTIVE SEED LOCALIZATION;  Surgeon: Coralie Keens, MD;  Location: [facility];  Service: General;  Laterality: Right;  . BUNIONECTOMY Right   . CHOLECYSTECTOMY N/A 01/15/2017   Procedure: LAPAROSCOPIC CHOLECYSTECTOMY;  Surgeon: Coralie Keens, MD;  Location: [facility];  Service: General;  Laterality: N/A;  . COLONOSCOPY    . REPAIR OF PERFORATED ULCER  1997  . umbicial hernia     as a baby   Current Outpatient Prescriptions on File Prior to Visit  Medication Sig Dispense Refill  . acetaminophen (TYLENOL) 500 MG tablet Take 1,000 mg by mouth every 6 (six) hours as needed for mild pain.    . Artificial Tear Solution (GENTEAL TEARS OP) Apply 1 drop to eye 2 (two) times daily as needed (irritation).    . cetirizine (ZYRTEC) 10 MG tablet Take 10 mg by mouth daily as needed for allergies.    . Cholecalciferol (VITAMIN D-3) 5000 units TABS Take 5,000 Units by mouth daily.     . cyclobenzaprine (FLEXERIL) 10 MG tablet Take 1 tablet (10 mg total) by mouth 3 (three) times daily as needed for muscle  spasms. 30 tablet 0  . fluticasone (FLONASE) 50 MCG/ACT nasal spray Place 2 sprays into both nostrils daily. 16 g 6  . ibuprofen (ADVIL,MOTRIN) 200 MG tablet Take 400 mg by mouth every 6 (six) hours as needed for headache or mild pain.    . Liniments (SALONPAS EX) Apply 1 patch topically daily as needed (pain).    Marland Kitchen loratadine (CLARITIN) 10 MG tablet Take 10 mg by mouth daily as needed for allergies.     . metFORMIN (GLUCOPHAGE) 1000 MG tablet TAKE 1 TABLET BY MOUTH TWICE DAILY WITH A MEAL 60 tablet 0  . oxyCODONE-acetaminophen (ROXICET) 5-325 MG tablet Take 1 tablet by mouth every 8 (eight) hours as needed for severe pain. 30 tablet 0  . tetrahydrozoline-zinc (VISINE-AC) 0.05-0.25 % ophthalmic solution Apply 2 drops to eye 3 (three) times daily as  needed (dry eyes).     No current facility-administered medications on file prior to visit.    Allergies  Allergen Reactions  . Influenza Vaccines Other (See Comments)    Seizures and shortness of breath  . Ibuprofen     UNSPECIFIED REACTION   . Egg Donia Pounds, Egg] Other (See Comments)    Sneezing    Social History   Social History  . Marital status: Married    Spouse name: N/A  . Number of children: N/A  . Years of education: N/A   Occupational History  . Not on file.   Social History Main Topics  . Smoking status: Former Smoker    Years: 25.00  . Smokeless tobacco: Never Used     Comment: 01/12/17-quit - 25 years  . Alcohol use No  . Drug use: No  . Sexual activity: Yes   Other Topics Concern  . Not on file   Social History Narrative  . No narrative on file   Family History  Problem Relation Age of Onset  . Kidney disease Father   . Diabetes Father   . Asthma Sister   . Diabetes Sister   . Diabetes Brother       Review of Systems  All other systems reviewed and are negative.      Objective:   Physical Exam  Constitutional: She appears well-developed and well-nourished. No distress.  Cardiovascular:  Normal rate, regular rhythm, normal heart sounds and intact distal pulses.   No murmur heard. Pulmonary/Chest: Effort normal and breath sounds normal. No respiratory distress. She has no wheezes. She has no rales.  Abdominal: Soft. Bowel sounds are normal. She exhibits no distension. There is no tenderness. There is no rebound and no guarding.  Skin: She is not diaphoretic.  Vitals reviewed.         Assessment & Plan:  Controlled type 2 diabetes mellitus without complication, without long-term current use of insulin ([place]) - Plan: COMPLETE METABOLIC PANEL WITH GFR, Lipid panel, Hemoglobin A1c, Microalbumin, urine  Benign essential HTN  Pressure is elevated but patient attributes it to anxiety.  I have asked her to check her blood pressure every day for the next week and report the values to me so that we can adjust her medication based on the blood pressures.  I would like to keep her systolic blood pressure less than 017 and her diastolic blood pressure less than 90.  She cannot tolerate a flu shot but she did receive Pneumovax 23 today.  Her diabetic eye exam is up-to-date but we need to get copies of those records.  Diabetic foot exam was performed today and is normal.  I will check a CMP, fasting lipid panel.  Goal LDL cholesterol is less than 100.  I will check hemoglobin A1c and her goal hemoglobin A1c is less than 7.  I will also check a urine  microalbumin.                                                                                                                                                                                                                                                                                                           +++++++++++

## 2017-09-11 NOTE — Addendum Note (Signed)
Addended by: Shary Decamp B on: 09/11/2017 04:27 PM   Modules accepted: Orders

## 2017-09-14 ENCOUNTER — Other Ambulatory Visit: Payer: BLUE CROSS/BLUE SHIELD

## 2017-09-22 ENCOUNTER — Telehealth: Payer: Self-pay | Admitting: Family Medicine

## 2017-09-22 ENCOUNTER — Other Ambulatory Visit: Payer: BLUE CROSS/BLUE SHIELD

## 2017-09-22 MED ORDER — METFORMIN HCL 1000 MG PO TABS: ORAL_TABLET | ORAL | 0 refills | Status: DC

## 2017-09-22 NOTE — Telephone Encounter (Signed)
Medication called/sent to requested pharmacy  

## 2017-09-22 NOTE — Telephone Encounter (Signed)
Patient came in for her blood work, wants to know can her metformin be called in?  [facility]

## 2017-09-23 LAB — LIPID PANEL
Cholesterol: 124 mg/dL (ref <200)
HDL: 44 mg/dL — ABNORMAL LOW (ref >50)
LDL Cholesterol (Calc): 61 mg/dL (calc)
Non-HDL Cholesterol (Calc): 80 mg/dL (calc) (ref <130)
Total CHOL/HDL Ratio: 2.8 (calc) (ref <5.0)
Triglycerides: 109 mg/dL (ref <150)

## 2017-09-23 LAB — HEMOGLOBIN A1C
Hgb A1c MFr Bld: 5.9 % of total Hgb — ABNORMAL HIGH (ref <5.7)
Mean Plasma Glucose: 123 (calc)
eAG (mmol/L): 6.8 (calc)

## 2017-09-23 LAB — COMPLETE METABOLIC PANEL WITH GFR
AG Ratio: 1.8 (calc) (ref 1.0–2.5)
ALT: 16 U/L (ref 6–29)
AST: 17 U/L (ref 10–35)
Albumin: 4.5 g/dL (ref 3.6–5.1)
Alkaline phosphatase (APISO): 57 U/L (ref 33–130)
BUN: 12 mg/dL (ref 7–25)
CO2: 26 mmol/L (ref 20–32)
Calcium: 9.4 mg/dL (ref 8.6–10.4)
Chloride: 104 mmol/L (ref 98–110)
Creat: 0.64 mg/dL (ref 0.50–1.05)
GFR, Est African American: 115 mL/min/{1.73_m2} (ref >=60)
GFR, Est Non African American: 99 mL/min/{1.73_m2} (ref >=60)
Globulin: 2.5 g/dL (calc) (ref 1.9–3.7)
Glucose, Bld: 128 mg/dL — ABNORMAL HIGH (ref 65–99)
Potassium: 4 mmol/L (ref 3.5–5.3)
Sodium: 139 mmol/L (ref 135–146)
Total Bilirubin: 0.7 mg/dL (ref 0.2–1.2)
Total Protein: 7 g/dL (ref 6.1–8.1)

## 2017-09-23 LAB — MICROALBUMIN, URINE: Microalb, Ur: 3.8 mg/dL

## 2017-09-24 ENCOUNTER — Encounter: Payer: Self-pay | Admitting: Family Medicine

## 2017-10-09 ENCOUNTER — Telehealth: Payer: BLUE CROSS/BLUE SHIELD | Admitting: Nurse Practitioner

## 2017-10-09 DIAGNOSIS — J0110 Acute frontal sinusitis, unspecified: Secondary | ICD-10-CM

## 2017-10-09 MED ORDER — AMOXICILLIN-POT CLAVULANATE 875-125 MG PO TABS: 1.0000 | ORAL_TABLET | Freq: Two times a day (BID) | ORAL | 0 refills | Status: DC

## 2017-10-09 NOTE — Progress Notes (Signed)

## 2017-12-22 ENCOUNTER — Telehealth: Payer: Self-pay | Admitting: Family Medicine

## 2017-12-22 MED ORDER — METFORMIN HCL 1000 MG PO TABS: ORAL_TABLET | ORAL | 0 refills | Status: DC

## 2017-12-22 NOTE — Telephone Encounter (Signed)
Medication called/sent to requested pharmacy  

## 2017-12-22 NOTE — Telephone Encounter (Signed)
Pt requesting refill on [facility] in [place].

## 2017-12-25 ENCOUNTER — Other Ambulatory Visit: Payer: Self-pay | Admitting: Family Medicine

## 2017-12-28 ENCOUNTER — Ambulatory Visit: Payer: BLUE CROSS/BLUE SHIELD | Admitting: Family Medicine

## 2017-12-28 ENCOUNTER — Telehealth: Payer: BLUE CROSS/BLUE SHIELD | Admitting: Family

## 2017-12-28 DIAGNOSIS — J028 Acute pharyngitis due to other specified organisms: Secondary | ICD-10-CM

## 2017-12-28 DIAGNOSIS — B9689 Other specified bacterial agents as the cause of diseases classified elsewhere: Secondary | ICD-10-CM

## 2017-12-28 MED ORDER — BENZONATATE 100 MG PO CAPS: 100.0000 mg | ORAL_CAPSULE | Freq: Three times a day (TID) | ORAL | 0 refills | Status: DC | PRN

## 2017-12-28 MED ORDER — PREDNISONE 5 MG PO TABS: 5.0000 mg | ORAL_TABLET | ORAL | 0 refills | Status: DC

## 2017-12-28 MED ORDER — AZITHROMYCIN 250 MG PO TABS: ORAL_TABLET | ORAL | 0 refills | Status: DC

## 2017-12-28 NOTE — Progress Notes (Signed)

## 2018-03-01 ENCOUNTER — Telehealth: Payer: BLUE CROSS/BLUE SHIELD | Admitting: Family

## 2018-03-01 DIAGNOSIS — B9689 Other specified bacterial agents as the cause of diseases classified elsewhere: Secondary | ICD-10-CM

## 2018-03-01 DIAGNOSIS — J329 Chronic sinusitis, unspecified: Secondary | ICD-10-CM

## 2018-03-01 MED ORDER — AMOXICILLIN-POT CLAVULANATE 875-125 MG PO TABS: 1.0000 | ORAL_TABLET | Freq: Two times a day (BID) | ORAL | 0 refills | Status: AC

## 2018-03-01 MED ORDER — BENZONATATE 100 MG PO CAPS: 100.0000 mg | ORAL_CAPSULE | Freq: Three times a day (TID) | ORAL | 0 refills | Status: DC | PRN

## 2018-03-01 NOTE — Progress Notes (Signed)
Thank you for the details you included in the comment boxes. Those details are very helpful in determining the best course of treatment for you and help [place] to provide the best care.  We are sorry that you are not feeling well.  Here is how we plan to help!  Based on what you have shared with me it looks like you have sinusitis.  Sinusitis is inflammation and infection in the sinus cavities of the head.  Based on your presentation I believe you most likely have Acute Bacterial Sinusitis.  This is an infection caused by bacteria and is treated with antibiotics. I have prescribed Augmentin 875mg /125mg  one tablet twice daily with food, for 7 days. You may use an oral decongestant such as Mucinex D or if you have glaucoma or high blood pressure use plain Mucinex. Saline nasal spray help and can safely be used as often as needed for congestion.  If you develop worsening sinus pain, fever or notice severe headache and vision changes, or if symptoms are not better after completion of antibiotic, please schedule an appointment with a health care provider.    I have also sent Tessalon Perles for cough, 100mg , take 1 or 2 every 8 hours for cough as needed.   Sinus infections are not as easily transmitted as other respiratory infection, however we still recommend that you avoid close contact with loved ones, especially the very young and elderly.  Remember to wash your hands thoroughly throughout the day as this is the number one way to prevent the spread of infection!  Home Care:  Only take medications as instructed by your medical team.  Complete the entire course of an antibiotic.  Do not take these medications with alcohol.  A steam or ultrasonic humidifier can help congestion.  You can place a towel over your head and breathe in the steam from hot water coming from a faucet.  Avoid close contacts especially the very young and the elderly.  Cover your mouth when you cough or sneeze.  Always remember  to wash your hands.  Get Help Right Away If:  You develop worsening fever or sinus pain.  You develop a severe head ache or visual changes.  Your symptoms persist after you have completed your treatment plan.  Make sure you  Understand these instructions.  Will watch your condition.  Will get help right away if you are not doing well or get worse.  Your e-visit answers were reviewed by a board certified advanced clinical practitioner to complete your personal care plan.  Depending on the condition, your plan could have included both over the counter or prescription medications.  If there is a problem please reply  once you have received a response from your provider.  Your safety is important to [place].  If you have drug allergies check your prescription carefully.    You can use MyChart to ask questions about today's visit, request a non-urgent call back, or ask for a work or school excuse for 24 hours related to this e-Visit. If it has been greater than 24 hours you will need to follow up with your provider, or enter a new e-Visit to address those concerns.  You will get an e-mail in the next two days asking about your experience.  I hope that your e-visit has been valuable and will speed your recovery. Thank you for using e-visits.

## 2018-03-18 ENCOUNTER — Ambulatory Visit (INDEPENDENT_AMBULATORY_CARE_PROVIDER_SITE_OTHER): Payer: BLUE CROSS/BLUE SHIELD | Admitting: Family Medicine

## 2018-03-18 ENCOUNTER — Telehealth: Payer: Self-pay | Admitting: Family Medicine

## 2018-03-18 VITALS — BP 154/86 | HR 97 | Temp 98.5°F | Resp 18 | Ht 65.5 in | Wt 218.0 lb

## 2018-03-18 DIAGNOSIS — E119 Type 2 diabetes mellitus without complications: Secondary | ICD-10-CM | POA: Diagnosis not present

## 2018-03-18 DIAGNOSIS — R300 Dysuria: Secondary | ICD-10-CM | POA: Diagnosis not present

## 2018-03-18 LAB — URINALYSIS, ROUTINE W REFLEX MICROSCOPIC
Bilirubin Urine: NEGATIVE
Glucose, UA: NEGATIVE
Hgb urine dipstick: NEGATIVE
Ketones, ur: NEGATIVE
Leukocytes, UA: NEGATIVE
Nitrite: NEGATIVE
Protein, ur: NEGATIVE
Specific Gravity, Urine: 1.025 (ref 1.001–1.03)
pH: 5 (ref 5.0–8.0)

## 2018-03-18 MED ORDER — LISINOPRIL 20 MG PO TABS: 20.0000 mg | ORAL_TABLET | Freq: Every day | ORAL | 3 refills | Status: DC

## 2018-03-18 MED ORDER — METFORMIN HCL 1000 MG PO TABS: ORAL_TABLET | ORAL | 1 refills | Status: DC

## 2018-03-18 NOTE — Telephone Encounter (Signed)
Pt needs metformin to [facility] [place], also change lisinopril to [facility] [place].

## 2018-03-18 NOTE — Telephone Encounter (Signed)
Medication called/sent to requested pharmacy  

## 2018-03-18 NOTE — Progress Notes (Signed)
Subjective:    Patient ID: Christina [name], female    DOB: Feb 24, 1961, 57 y.o.   MRN: [id number]  HPI  Patient is here today for follow-up of her diabetes.  She is currently taking metformin 1000 mg p.o. twice daily.  She is not checking her sugars but when she does they typically range between 101 120.  She denies any episodes of hypoglycemia.  She was recently treated over the Internet by Dr. for a sinus infection.  She is questioning if she has a yeast infection.  She took some Monistat and her vaginal irritation improved however she continues to have some mild dysuria.  Urinalysis today is completely normal.  According to the patient, vaginal irritation has resolved.  Therefore I do not feel further work-up is necessary for this.  Her blood pressure is elevated 154/86.  She denies any chest pain shortness of breath or dyspnea on exertion.  She is overdue for fasting lab work however she is not fasting. Past Medical History:  Diagnosis Date  . Allergy   . Arthritis   . Asthma   . DDD (degenerative disc disease), lumbar   . Diabetes mellitus without complication ([place])    Type II  . GERD (gastroesophageal reflux disease)   . H/O seasonal allergies   . Perforated ulcer ([place])    due to ibuprofen  . Ulcer    perforated   Past Surgical History:  Procedure Laterality Date  . BREAST LUMPECTOMY WITH RADIOACTIVE SEED LOCALIZATION Right 01/15/2017   Procedure: BREAST LUMPECTOMY WITH RADIOACTIVE SEED LOCALIZATION;  Surgeon: Coralie Keens, MD;  Location: [facility];  Service: General;  Laterality: Right;  . BUNIONECTOMY Right   . CHOLECYSTECTOMY N/A 01/15/2017   Procedure: LAPAROSCOPIC CHOLECYSTECTOMY;  Surgeon: Coralie Keens, MD;  Location: [facility];  Service: General;  Laterality: N/A;  . COLONOSCOPY    . REPAIR OF PERFORATED ULCER  1997  . umbicial hernia     as a baby   Current Outpatient Medications on File Prior to Visit  Medication Sig Dispense Refill  . fluticasone (FLONASE) 50  MCG/ACT nasal spray USE 2 SPRAY(S) IN EACH NOSTRIL ONCE DAILY 16 g 6  . loratadine (CLARITIN) 10 MG tablet Take 10 mg by mouth daily as needed for allergies.      No current facility-administered medications on file prior to visit.    Allergies  Allergen Reactions  . Influenza Vaccines Other (See Comments)    Seizures and shortness of breath  . Ibuprofen     UNSPECIFIED REACTION   . Egg Donia Pounds, Egg] Other (See Comments)    Sneezing    Social History   Socioeconomic History  . Marital status: Married    Spouse name: Not on file  . Number of children: Not on file  . Years of education: Not on file  . Highest education level: Not on file  Occupational History  . Not on file  Social Needs  . Financial resource strain: Not on file  . Food insecurity:    Worry: Not on file    Inability: Not on file  . Transportation needs:    Medical: Not on file    Non-medical: Not on file  Tobacco Use  . Smoking status: Former Smoker    Years: 25.00  . Smokeless tobacco: Never Used  . Tobacco comment: 01/12/17-quit - 25 years  Substance and Sexual Activity  . Alcohol use: No  . Drug use: No  . Sexual activity: Yes  Lifestyle  .  Physical activity:    Days per week: Not on file    Minutes per session: Not on file  . Stress: Not on file  Relationships  . Social connections:    Talks on phone: Not on file    Gets together: Not on file    Attends religious service: Not on file    Active member of club or organization: Not on file    Attends meetings of clubs or organizations: Not on file    Relationship status: Not on file  . Intimate partner violence:    Fear of current or ex partner: Not on file    Emotionally abused: Not on file    Physically abused: Not on file    Forced sexual activity: Not on file  Other Topics Concern  . Not on file  Social History Narrative  . Not on file      Review of Systems  All other systems reviewed and are negative.      Objective:     Physical Exam  Constitutional: She appears well-developed and well-nourished.  Neck: No thyromegaly present.  Cardiovascular: Normal rate, regular rhythm, normal heart sounds and intact distal pulses.  Pulmonary/Chest: Effort normal and breath sounds normal. No stridor. No respiratory distress. She has no wheezes. She has no rales.  Abdominal: Soft. Bowel sounds are normal. She exhibits no distension and no mass. There is no tenderness. There is no guarding.  Lymphadenopathy:    She has no cervical adenopathy.  Vitals reviewed.         Assessment & Plan:  Dysuria - Plan: Urinalysis, Routine w reflex microscopic  Controlled type 2 diabetes mellitus without complication, without long-term current use of insulin (HCC) - Plan: Microalbumin, urine, Hemoglobin A1c, COMPLETE METABOLIC PANEL WITH GFR, CBC with Differential/Platelet  Urinalysis is unremarkable.  There is no evidence of urinary tract infection.  I will check her hemoglobin A1c.  If less than 6.5, her diabetes is adequately controlled.  If much below 6, we can maybe discontinue the metformin.  I would like her to return for a fasting lipid panel at her convenience.  I will also check a CBC as well as a CMP along with a urine microalbumin.  Given her elevated blood pressure, I will start the patient on lisinopril 20 mg a day and recheck her blood pressure in 1 month.  Patient then asked me to check for vitamin D, vitamin B12 deficiency, to complete a handicap placard.  At this point our office visit had consumed is a lot of time and I told the patient that I would not be able to address all of her concerns in one visit but that I would be happy to try to handle them in a future visit or we could devote adequate time to all of her concerns.

## 2018-03-19 ENCOUNTER — Encounter (INDEPENDENT_AMBULATORY_CARE_PROVIDER_SITE_OTHER): Payer: Self-pay

## 2018-03-19 LAB — CBC WITH DIFFERENTIAL/PLATELET
Basophils Absolute: 68 cells/uL (ref 0–200)
Basophils Relative: 0.7 %
Eosinophils Absolute: 592 cells/uL — ABNORMAL HIGH (ref 15–500)
Eosinophils Relative: 6.1 %
HCT: 38.6 % (ref 35.0–45.0)
Hemoglobin: 13.2 g/dL (ref 11.7–15.5)
Lymphs Abs: 3531 cells/uL (ref 850–3900)
MCH: 29.3 pg (ref 27.0–33.0)
MCHC: 34.2 g/dL (ref 32.0–36.0)
MCV: 85.8 fL (ref 80.0–100.0)
MPV: 11.7 fL (ref 7.5–12.5)
Monocytes Relative: 7.5 %
Neutro Abs: 4782 cells/uL (ref 1500–7800)
Neutrophils Relative %: 49.3 %
Platelets: 260 10*3/uL (ref 140–400)
RBC: 4.5 10*6/uL (ref 3.80–5.10)
RDW: 12.7 % (ref 11.0–15.0)
Total Lymphocyte: 36.4 %
WBC mixed population: 728 cells/uL (ref 200–950)
WBC: 9.7 10*3/uL (ref 3.8–10.8)

## 2018-03-19 LAB — COMPLETE METABOLIC PANEL WITH GFR
AG Ratio: 1.9 (calc) (ref 1.0–2.5)
ALT: 18 U/L (ref 6–29)
AST: 21 U/L (ref 10–35)
Albumin: 4.5 g/dL (ref 3.6–5.1)
Alkaline phosphatase (APISO): 55 U/L (ref 33–130)
BUN: 14 mg/dL (ref 7–25)
CO2: 25 mmol/L (ref 20–32)
Calcium: 9.7 mg/dL (ref 8.6–10.4)
Chloride: 106 mmol/L (ref 98–110)
Creat: 0.92 mg/dL (ref 0.50–1.05)
GFR, Est African American: 80 mL/min/{1.73_m2} (ref >=60)
GFR, Est Non African American: 69 mL/min/{1.73_m2} (ref >=60)
Globulin: 2.4 g/dL (calc) (ref 1.9–3.7)
Glucose, Bld: 129 mg/dL — ABNORMAL HIGH (ref 65–99)
Potassium: 4.2 mmol/L (ref 3.5–5.3)
Sodium: 141 mmol/L (ref 135–146)
Total Bilirubin: 0.8 mg/dL (ref 0.2–1.2)
Total Protein: 6.9 g/dL (ref 6.1–8.1)

## 2018-03-19 LAB — HEMOGLOBIN A1C
Hgb A1c MFr Bld: 6.1 % of total Hgb — ABNORMAL HIGH (ref <5.7)
Mean Plasma Glucose: 128 (calc)
eAG (mmol/L): 7.1 (calc)

## 2018-03-19 LAB — MICROALBUMIN, URINE: Microalb, Ur: 1.2 mg/dL

## 2018-09-09 ENCOUNTER — Other Ambulatory Visit: Payer: Self-pay | Admitting: Family Medicine

## 2018-09-13 ENCOUNTER — Ambulatory Visit (INDEPENDENT_AMBULATORY_CARE_PROVIDER_SITE_OTHER): Payer: BLUE CROSS/BLUE SHIELD | Admitting: Family Medicine

## 2018-09-13 ENCOUNTER — Encounter: Payer: Self-pay | Admitting: Family Medicine

## 2018-09-13 VITALS — BP 142/74 | HR 82 | Temp 98.1°F | Resp 16 | Ht 65.5 in | Wt 210.0 lb

## 2018-09-13 DIAGNOSIS — E559 Vitamin D deficiency, unspecified: Secondary | ICD-10-CM | POA: Diagnosis not present

## 2018-09-13 DIAGNOSIS — I10 Essential (primary) hypertension: Secondary | ICD-10-CM

## 2018-09-13 DIAGNOSIS — E119 Type 2 diabetes mellitus without complications: Secondary | ICD-10-CM | POA: Diagnosis not present

## 2018-09-13 NOTE — Progress Notes (Signed)
Subjective:    Patient ID: Christina [name], female    DOB: 05/29/1961, 57 y.o.   MRN: [id number]  HPI Patient is a very pleasant 57 year old Caucasian female here today for follow-up of her diabetes.  She states that she is not checking her sugars.  She has lost little bit of weight since her last visit.  She denies any overt polyuria or polydipsia or blurry vision.  She does have neuropathy in her feet and reports numbness as well as tingling and pain particularly in the toes of both feet.  Diabetic foot exam is performed today and is normal.  She is overdue for mammogram but she states that she prefers to schedule this.  She had a colonoscopy approximately 2 years ago although I do not have records of this.  She states that she will happily will sign a release of information so that we can get this information.  She is due today for a flu shot however she reports an allergy to flu shot and she declines this.  She has had Pneumovax 23.  Her last diabetic eye exam was approximately 1-1/2 years ago.  She is due for another one has scheduled this for her Saturday appointment.  There is no report of chest pain shortness of breath or dyspnea on exertion Past Medical History:  Diagnosis Date  . Allergy   . Arthritis   . Asthma   . DDD (degenerative disc disease), lumbar   . Diabetes mellitus without complication ([place])    Type II  . GERD (gastroesophageal reflux disease)   . H/O seasonal allergies   . Perforated ulcer ([place])    due to ibuprofen  . Ulcer    perforated   Past Surgical History:  Procedure Laterality Date  . BREAST LUMPECTOMY WITH RADIOACTIVE SEED LOCALIZATION Right 01/15/2017   Procedure: BREAST LUMPECTOMY WITH RADIOACTIVE SEED LOCALIZATION;  Surgeon: Coralie Keens, MD;  Location: [facility];  Service: General;  Laterality: Right;  . BUNIONECTOMY Right   . CHOLECYSTECTOMY N/A 01/15/2017   Procedure: LAPAROSCOPIC CHOLECYSTECTOMY;  Surgeon: Coralie Keens, MD;  Location: [facility];   Service: General;  Laterality: N/A;  . COLONOSCOPY    . REPAIR OF PERFORATED ULCER  1997  . umbicial hernia     as a baby   Current Outpatient Medications on File Prior to Visit  Medication Sig Dispense Refill  . fluticasone (FLONASE) 50 MCG/ACT nasal spray USE 2 SPRAY(S) IN EACH NOSTRIL ONCE DAILY 16 g 6  . lisinopril (PRINIVIL,ZESTRIL) 20 MG tablet Take 1 tablet (20 mg total) by mouth daily. 90 tablet 3  . loratadine (CLARITIN) 10 MG tablet Take 10 mg by mouth daily as needed for allergies.     . metFORMIN (GLUCOPHAGE) 1000 MG tablet TAKE 1 TABLET BY MOUTH TWICE DAILY WITH A MEAL 180 tablet 1   No current facility-administered medications on file prior to visit.    Allergies  Allergen Reactions  . Influenza Vaccines Other (See Comments)    Seizures and shortness of breath  . Ibuprofen     UNSPECIFIED REACTION   . Egg Donia Pounds, Egg] Other (See Comments)    Sneezing    Social History   Socioeconomic History  . Marital status: Married    Spouse name: Not on file  . Number of children: Not on file  . Years of education: Not on file  . Highest education level: Not on file  Occupational History  . Not on file  Social Needs  .  Financial resource strain: Not on file  . Food insecurity:    Worry: Not on file    Inability: Not on file  . Transportation needs:    Medical: Not on file    Non-medical: Not on file  Tobacco Use  . Smoking status: Former Smoker    Years: 25.00  . Smokeless tobacco: Never Used  . Tobacco comment: 01/12/17-quit - 25 years  Substance and Sexual Activity  . Alcohol use: No  . Drug use: No  . Sexual activity: Yes  Lifestyle  . Physical activity:    Days per week: Not on file    Minutes per session: Not on file  . Stress: Not on file  Relationships  . Social connections:    Talks on phone: Not on file    Gets together: Not on file    Attends religious service: Not on file    Active member of club or organization: Not on file    Attends  meetings of clubs or organizations: Not on file    Relationship status: Not on file  . Intimate partner violence:    Fear of current or ex partner: Not on file    Emotionally abused: Not on file    Physically abused: Not on file    Forced sexual activity: Not on file  Other Topics Concern  . Not on file  Social History Narrative  . Not on file   Family History  Problem Relation Age of Onset  . Kidney disease Father   . Diabetes Father   . Asthma Sister   . Diabetes Sister   . Diabetes Brother       Review of Systems  All other systems reviewed and are negative.      Objective:   Physical Exam  Constitutional: She appears well-developed and well-nourished. No distress.  Cardiovascular: Normal rate, regular rhythm, normal heart sounds and intact distal pulses.   No murmur heard. Pulmonary/Chest: Effort normal and breath sounds normal. No respiratory distress. She has no wheezes. She has no rales.  Abdominal: Soft. Bowel sounds are normal. She exhibits no distension. There is no tenderness. There is no rebound and no guarding.  Skin: She is not diaphoretic.  Vitals reviewed.         Assessment & Plan:  Controlled type 2 diabetes mellitus without complication, without long-term current use of insulin ([place]) - Plan: CBC with Differential/Platelet, COMPLETE METABOLIC PANEL WITH GFR, Lipid panel, Microalbumin, urine, Hemoglobin A1c  Vitamin D deficiency - Plan: VITAMIN D 25 Hydroxy (Vit-D Deficiency, Fractures)  Benign essential HTN  Blood pressure today is borderline at 142/74 but I would not change her medication.  She is already on an ACE inhibitor.  I would calculate her 10-year risk of cardiovascular disease.  If greater than 10% I would recommend an aspirin.  She is not on a statin medication.  I would recommend this for diabetic however she would like to get a baseline cholesterol first and then calculate her 10-year risk prior to starting a statin.  She has a history  of vitamin D deficiency and would like to check her vitamin D level as well.  She will schedule her mammogram.  Her colonoscopy is up-to-date but I need to get records of this.  She is overdue for Pap smear and she states that she will schedule this as well.  She declines a flu shot.  Pneumovax 23 is up-to-date.

## 2018-09-14 ENCOUNTER — Other Ambulatory Visit: Payer: Self-pay | Admitting: Family Medicine

## 2018-09-14 LAB — LIPID PANEL
Cholesterol: 112 mg/dL (ref <200)
HDL: 37 mg/dL — ABNORMAL LOW (ref >50)
LDL Cholesterol (Calc): 50 mg/dL (calc)
Non-HDL Cholesterol (Calc): 75 mg/dL (calc) (ref <130)
Total CHOL/HDL Ratio: 3 (calc) (ref <5.0)
Triglycerides: 169 mg/dL — ABNORMAL HIGH (ref <150)

## 2018-09-14 LAB — COMPLETE METABOLIC PANEL WITH GFR
AG Ratio: 1.8 (calc) (ref 1.0–2.5)
ALT: 18 U/L (ref 6–29)
AST: 27 U/L (ref 10–35)
Albumin: 4.4 g/dL (ref 3.6–5.1)
Alkaline phosphatase (APISO): 54 U/L (ref 33–130)
BUN: 12 mg/dL (ref 7–25)
CO2: 23 mmol/L (ref 20–32)
Calcium: 9.4 mg/dL (ref 8.6–10.4)
Chloride: 105 mmol/L (ref 98–110)
Creat: 0.8 mg/dL (ref 0.50–1.05)
GFR, Est African American: 94 mL/min/{1.73_m2} (ref >=60)
GFR, Est Non African American: 81 mL/min/{1.73_m2} (ref >=60)
Globulin: 2.4 g/dL (calc) (ref 1.9–3.7)
Glucose, Bld: 113 mg/dL — ABNORMAL HIGH (ref 65–99)
Potassium: 4.3 mmol/L (ref 3.5–5.3)
Sodium: 141 mmol/L (ref 135–146)
Total Bilirubin: 0.8 mg/dL (ref 0.2–1.2)
Total Protein: 6.8 g/dL (ref 6.1–8.1)

## 2018-09-14 LAB — CBC WITH DIFFERENTIAL/PLATELET
Basophils Absolute: 71 cells/uL (ref 0–200)
Basophils Relative: 0.7 %
Eosinophils Absolute: 745 cells/uL — ABNORMAL HIGH (ref 15–500)
Eosinophils Relative: 7.3 %
HCT: 38.6 % (ref 35.0–45.0)
Hemoglobin: 13.1 g/dL (ref 11.7–15.5)
Lymphs Abs: 3101 cells/uL (ref 850–3900)
MCH: 29.8 pg (ref 27.0–33.0)
MCHC: 33.9 g/dL (ref 32.0–36.0)
MCV: 87.9 fL (ref 80.0–100.0)
MPV: 11.1 fL (ref 7.5–12.5)
Monocytes Relative: 5.5 %
Neutro Abs: 5722 cells/uL (ref 1500–7800)
Neutrophils Relative %: 56.1 %
Platelets: 294 10*3/uL (ref 140–400)
RBC: 4.39 10*6/uL (ref 3.80–5.10)
RDW: 12.5 % (ref 11.0–15.0)
Total Lymphocyte: 30.4 %
WBC mixed population: 561 cells/uL (ref 200–950)
WBC: 10.2 10*3/uL (ref 3.8–10.8)

## 2018-09-14 LAB — MICROALBUMIN, URINE: Microalb, Ur: 0.9 mg/dL

## 2018-09-14 LAB — VITAMIN D 25 HYDROXY (VIT D DEFICIENCY, FRACTURES): Vit D, 25-Hydroxy: 24 ng/mL — ABNORMAL LOW (ref 30–100)

## 2018-09-14 LAB — HEMOGLOBIN A1C
Hgb A1c MFr Bld: 6 % of total Hgb — ABNORMAL HIGH (ref <5.7)
Mean Plasma Glucose: 126 (calc)
eAG (mmol/L): 7 (calc)

## 2018-09-14 MED ORDER — METFORMIN HCL 500 MG PO TABS: 500.0000 mg | ORAL_TABLET | Freq: Two times a day (BID) | ORAL | 3 refills | Status: DC

## 2018-09-14 MED ORDER — VITAMIN D (ERGOCALCIFEROL) 1.25 MG (50000 UNIT) PO CAPS: 50000.0000 [IU] | ORAL_CAPSULE | ORAL | 1 refills | Status: DC

## 2018-09-17 ENCOUNTER — Telehealth: Payer: Self-pay | Admitting: Family Medicine

## 2018-09-17 ENCOUNTER — Other Ambulatory Visit: Payer: Self-pay | Admitting: Family Medicine

## 2018-09-17 MED ORDER — METFORMIN HCL 500 MG PO TABS: 500.0000 mg | ORAL_TABLET | Freq: Two times a day (BID) | ORAL | 3 refills | Status: AC

## 2018-09-17 MED ORDER — FLUCONAZOLE 150 MG PO TABS: 150.0000 mg | ORAL_TABLET | Freq: Once | ORAL | 0 refills | Status: AC

## 2018-09-17 MED ORDER — VITAMIN D (ERGOCALCIFEROL) 1.25 MG (50000 UNIT) PO CAPS: 50000.0000 [IU] | ORAL_CAPSULE | ORAL | 1 refills | Status: DC

## 2018-09-17 NOTE — Telephone Encounter (Signed)
Patient is calling to say that her metformin was called into the wrong pharmacy, would like it to be called into the [facility] in [place], also she is getting yeast infection would like to know if 2 diflucan can be called in

## 2018-09-17 NOTE — Telephone Encounter (Signed)
[facility] for the diflucan?

## 2018-09-17 NOTE — Telephone Encounter (Signed)
Ok x 2  

## 2018-09-17 NOTE — Telephone Encounter (Signed)
Medication called/sent to requested pharmacy and pt aware 

## 2018-10-03 ENCOUNTER — Telehealth: Payer: BLUE CROSS/BLUE SHIELD | Admitting: Family

## 2018-10-03 DIAGNOSIS — B9689 Other specified bacterial agents as the cause of diseases classified elsewhere: Secondary | ICD-10-CM

## 2018-10-03 DIAGNOSIS — J028 Acute pharyngitis due to other specified organisms: Secondary | ICD-10-CM

## 2018-10-03 MED ORDER — BENZONATATE 100 MG PO CAPS: 100.0000 mg | ORAL_CAPSULE | Freq: Three times a day (TID) | ORAL | 0 refills | Status: AC | PRN

## 2018-10-03 MED ORDER — PREDNISONE 5 MG PO TABS: 5.0000 mg | ORAL_TABLET | ORAL | 0 refills | Status: DC

## 2018-10-03 MED ORDER — AZITHROMYCIN 250 MG PO TABS: ORAL_TABLET | ORAL | 0 refills | Status: DC

## 2018-10-03 NOTE — Progress Notes (Signed)
Thank you for the details you included in the comment boxes. Those details are very helpful in determining the best course of treatment for you and help [place] to provide the best care. After our telephone discussion about your eyes, it may be irritation or allergies rather than an active infection of the eyes. See plan below.   We are sorry that you are not feeling well.  Here is how we plan to help!  Based on your presentation I believe you most likely have A cough due to bacteria.  When patients have a fever and a productive cough with a change in color or increased sputum production, we are concerned about bacterial bronchitis.  If left untreated it can progress to pneumonia.  If your symptoms do not improve with your treatment plan it is important that you contact your provider.   I have prescribed Azithromyin 250 mg: two tablets now and then one tablet daily for 4 additonal days    In addition you may use A non-prescription cough medication called Mucinex DM: take 2 tablets every 12 hours. and A prescription cough medication called Tessalon Perles 100mg . You may take 1-2 capsules every 8 hours as needed for your cough.  Prednisone 5 mg daily for 6 days (see taper instructions below)  Directions for 6 day taper: Day 1: 2 tablets before breakfast, 1 after both lunch & dinner and 2 at bedtime Day 2: 1 tab before breakfast, 1 after both lunch & dinner and 2 at bedtime Day 3: 1 tab at each meal & 1 at bedtime Day 4: 1 tab at breakfast, 1 at lunch, 1 at bedtime Day 5: 1 tab at breakfast & 1 tab at bedtime Day 6: 1 tab at breakfast   From your responses in the eVisit questionnaire you describe inflammation in the upper respiratory tract which is causing a significant cough.  This is commonly called Bronchitis and has four common causes:    Allergies  Viral Infections  Acid Reflux  Bacterial Infection Allergies, viruses and acid reflux are treated by controlling symptoms or eliminating the  cause. An example might be a cough caused by taking certain blood pressure medications. You stop the cough by changing the medication. Another example might be a cough caused by acid reflux. Controlling the reflux helps control the cough.  USE OF BRONCHODILATOR ("RESCUE") INHALERS: There is a risk from using your bronchodilator too frequently.  The risk is that over-reliance on a medication which only relaxes the muscles surrounding the breathing tubes can reduce the effectiveness of medications prescribed to reduce swelling and congestion of the tubes themselves.  Although you feel brief relief from the bronchodilator inhaler, your asthma may actually be worsening with the tubes becoming more swollen and filled with mucus.  This can delay other crucial treatments, such as oral steroid medications. If you need to use a bronchodilator inhaler daily, several times per day, you should discuss this with your provider.  There are probably better treatments that could be used to keep your asthma under control.     HOME CARE . Only take medications as instructed by your medical team. . Complete the entire course of an antibiotic. . Drink plenty of fluids and get plenty of rest. . Avoid close contacts especially the very young and the elderly . Cover your mouth if you cough or cough into your sleeve. . Always remember to wash your hands . A steam or ultrasonic humidifier can help congestion.   GET HELP RIGHT  AWAY IF: . You develop worsening fever. . You become short of breath . You cough up blood. . Your symptoms persist after you have completed your treatment plan MAKE SURE YOU   Understand these instructions.  Will watch your condition.  Will get help right away if you are not doing well or get worse.  Your e-visit answers were reviewed by a board certified advanced clinical practitioner to complete your personal care plan.  Depending on the condition, your plan could have included both over the  counter or prescription medications. If there is a problem please reply  once you have received a response from your provider. Your safety is important to [place].  If you have drug allergies check your prescription carefully.    You can use MyChart to ask questions about today's visit, request a non-urgent call back, or ask for a work or school excuse for 24 hours related to this e-Visit. If it has been greater than 24 hours you will need to follow up with your provider, or enter a new e-Visit to address those concerns. You will get an e-mail in the next two days asking about your experience.  I hope that your e-visit has been valuable and will speed your recovery. Thank you for using e-visits.

## 2018-10-16 LAB — HM DIABETES EYE EXAM

## 2018-11-26 ENCOUNTER — Encounter: Payer: Self-pay | Admitting: Family Medicine

## 2018-11-26 ENCOUNTER — Telehealth: Payer: BLUE CROSS/BLUE SHIELD | Admitting: Family Medicine

## 2018-11-26 DIAGNOSIS — J069 Acute upper respiratory infection, unspecified: Secondary | ICD-10-CM

## 2018-11-26 MED ORDER — BENZONATATE 100 MG PO CAPS: 100.0000 mg | ORAL_CAPSULE | Freq: Two times a day (BID) | ORAL | 0 refills | Status: AC | PRN

## 2018-11-26 MED ORDER — AZELASTINE HCL 0.1 % NA SOLN: 1.0000 | Freq: Two times a day (BID) | NASAL | 0 refills | Status: AC

## 2018-11-26 NOTE — Progress Notes (Signed)
We are sorry you are not feeling well.  Here is how we plan to help!  Based on what you have shared with me, it looks like you may have a viral upper respiratory infection or a "common cold".  Colds are caused by a large number of viruses; however, rhinovirus is the most common cause.   Symptoms of the common cold vary from person to person, with common symptoms including sore throat, cough, and malaise.  A low-grade fever of 100.4 may present, but is often uncommon.  Symptoms vary however, and are closely related to a person's age or underlying illnesses.  The most common symptoms associated with the common cold are nasal discharge or congestion, cough, sneezing, headache and pressure in the ears and face.  Cold symptoms usually persist for about 3 to 10 days, but can last up to 2 weeks.  It is important to know that colds do not cause serious illness or complications in most cases.    The common cold is transmitted from person to person, with the most common method of transmission being a person's hands.  The virus is able to live on the skin and can infect other persons for up to 2 hours after direct contact.  Also, colds are transmitted when someone coughs or sneezes; thus, it is important to cover the mouth to reduce this risk.  To keep the spread of the common cold at [place], good hand hygiene is very important.  This is an infection that is most likely caused by a virus. There are no specific treatments for the common cold other than to help you with the symptoms until the infection runs its course.    For nasal congestion, you may use an oral decongestants such as Mucinex D or if you have glaucoma or high blood pressure use plain Mucinex.  Saline nasal spray or nasal drops can help and can safely be used as often as needed for congestion.  For your congestion, I have prescribed Azelastine nasal spray two sprays in each nostril twice a day  If you do not have a history of heart disease, hypertension,  diabetes or thyroid disease, prostate/bladder issues or glaucoma, you may also use Sudafed to treat nasal congestion.  It is highly recommended that you consult with a pharmacist or your primary care physician to ensure this medication is safe for you to take.     If you have a cough, you may use cough suppressants such as Delsym and Robitussin.  If you have glaucoma or high blood pressure, you can also use Coricidin HBP.   For cough I have prescribed for you A prescription cough medication called Tessalon Perles 100 mg. You may take 1-2 capsules every 8 hours as needed for cough  If you have a sore or scratchy throat, use a saltwater gargle-  to  teaspoon of salt dissolved in a 4-ounce to 8-ounce glass of warm water.  Gargle the solution for approximately 15-30 seconds and then spit.  It is important not to swallow the solution.  You can also use throat lozenges/cough drops and Chloraseptic spray to help with throat pain or discomfort.  Warm or cold liquids can also be helpful in relieving throat pain.  For headache, pain or general discomfort, you can use Ibuprofen or Tylenol as directed.   Some authorities believe that zinc sprays or the use of Echinacea may shorten the course of your symptoms.  If you feel you need more comprehensive care than what was  recommended here I believe the best option is face to face evaluation for you and your husband.    HOME CARE . Only take medications as instructed by your medical team. . Be sure to drink plenty of fluids. Water is fine as well as fruit juices, sodas and electrolyte beverages. You may want to stay away from caffeine or alcohol. If you are nauseated, try taking small sips of liquids. How do you know if you are getting enough fluid? Your urine should be a pale yellow or almost colorless. . Get rest. . Taking a steamy shower or using a humidifier may help nasal congestion and ease sore throat pain. You can place a towel over your head and breathe  in the steam from hot water coming from a faucet. . Using a saline nasal spray works much the same way. . Cough drops, hard candies and sore throat lozenges may ease your cough. . Avoid close contacts especially the very young and the elderly . Cover your mouth if you cough or sneeze . Always remember to wash your hands.   GET HELP RIGHT AWAY IF: . You develop worsening fever. . If your symptoms do not improve within 10 days . You develop yellow or green discharge from your nose over 3 days. . You have coughing fits . You develop a severe head ache or visual changes. . You develop shortness of breath, difficulty breathing or start having chest pain . Your symptoms persist after you have completed your treatment plan  MAKE SURE YOU   Understand these instructions.  Will watch your condition.  Will get help right away if you are not doing well or get worse.  Your e-visit answers were reviewed by a board certified advanced clinical practitioner to complete your personal care plan. Depending upon the condition, your plan could have included both over the counter or prescription medications. Please review your pharmacy choice. If there is a problem, you may call our nursing hot line at and have the prescription routed to another pharmacy. Your safety is important to [place]. If you have drug allergies check your prescription carefully.   You can use MyChart to ask questions about today's visit, request a non-urgent call back, or ask for a work or school excuse for 24 hours related to this e-Visit. If it has been greater than 24 hours you will need to follow up with your provider, or enter a new e-Visit to address those concerns. You will get an e-mail in the next two days asking about your experience.  I hope that your e-visit has been valuable and will speed your recovery. Thank you for using e-visits.

## 2018-11-30 ENCOUNTER — Encounter: Payer: Self-pay | Admitting: *Deleted

## 2018-11-30 ENCOUNTER — Ambulatory Visit: Payer: Self-pay | Admitting: Physician Assistant

## 2018-11-30 VITALS — BP 135/88 | HR 87 | Temp 98.6°F | Resp 18 | Wt 210.4 lb

## 2018-11-30 DIAGNOSIS — R05 Cough: Secondary | ICD-10-CM

## 2018-11-30 DIAGNOSIS — J0100 Acute maxillary sinusitis, unspecified: Secondary | ICD-10-CM

## 2018-11-30 DIAGNOSIS — R059 Cough, unspecified: Secondary | ICD-10-CM

## 2018-11-30 DIAGNOSIS — B309 Viral conjunctivitis, unspecified: Secondary | ICD-10-CM

## 2018-11-30 MED ORDER — PREDNISONE 20 MG PO TABS: 40.0000 mg | ORAL_TABLET | Freq: Every day | ORAL | 0 refills | Status: AC

## 2018-11-30 MED ORDER — FLUTICASONE PROPIONATE 50 MCG/ACT NA SUSP: 2.0000 | Freq: Every day | NASAL | 0 refills | Status: DC

## 2018-11-30 MED ORDER — AMOXICILLIN-POT CLAVULANATE 875-125 MG PO TABS: 1.0000 | ORAL_TABLET | Freq: Two times a day (BID) | ORAL | 0 refills | Status: AC

## 2018-11-30 MED ORDER — NAPHAZOLINE-PHENIRAMINE 0.025-0.3 % OP SOLN: 1.0000 [drp] | Freq: Four times a day (QID) | OPHTHALMIC | 0 refills | Status: AC | PRN

## 2018-11-30 MED ORDER — ALBUTEROL SULFATE HFA 108 (90 BASE) MCG/ACT IN AERS: 2.0000 | INHALATION_SPRAY | RESPIRATORY_TRACT | 0 refills | Status: AC | PRN

## 2018-11-30 MED ORDER — PROMETHAZINE-DM 6.25-15 MG/5ML PO SYRP: 5.0000 mL | ORAL_SOLUTION | Freq: Four times a day (QID) | ORAL | 0 refills | Status: AC | PRN

## 2018-11-30 NOTE — Patient Instructions (Addendum)
Sinusitis, Adult  Thank you for choosing [facility] for your health care needs.   You have been diagnosed with sinusitis and cough.    Recommend increase fluids; water, Gatorade, or hot tea with water or lemon. May use humidifier or vaporizer in bedroom. Use several pillows to prop self up at night, may help with coughing and sleeping. Rest.  Start antibiotic and complete entire course.    You have been prescribed a steroid, prednisone.. Take with food to prevent stomach upset. Take in the morning, can cause insomnia/difficulty sleeping.   You have been prescribed an inhaler. Use 2 puffs every 4-6 hours (when awake) for cough/wheezing.   You have been prescribed a prescription cough syrup, Phenergan-DM. Use at night to help with cough and sleeping.  Use eye drops for eye itching.   Use flonase and zyrtec for underlying allergies.    Hope you feel better soon.   Follow-up with family physician, or urgent care in 7 days if symptoms not improving. Sooner with any worsening symptoms. .   Sinusitis is soreness and swelling (inflammation) of your sinuses. Sinuses are hollow spaces in the bones around your face. They are located:  Around your eyes.  In the middle of your forehead.  Behind your nose.  In your cheekbones. Your sinuses and nasal passages are lined with a fluid called mucus. Mucus drains out of your sinuses. Swelling can trap mucus in your sinuses. This lets germs (bacteria, virus, or fungus) grow, which leads to infection. Most of the time, this condition is caused by a virus. What are the causes? This condition is caused by:  Allergies.  Asthma.  Germs.  Things that block your nose or sinuses.  Growths in the nose (nasal polyps).  Chemicals or irritants in the air.  Fungus (rare). What increases the risk? You are more likely to develop this condition if:  You have a weak body defense system (immune system).  You do a lot of swimming or diving.  You  use nasal sprays too much.  You smoke. What are the signs or symptoms? The main symptoms of this condition are pain and a feeling of pressure around the sinuses. Other symptoms include:  Stuffy nose (congestion).  Runny nose (drainage).  Swelling and warmth in the sinuses.  Headache.  Toothache.  A cough that may get worse at night.  Mucus that collects in the throat or the back of the nose (postnasal drip).  Being unable to smell and taste.  Being very tired (fatigue).  A fever.  Sore throat.  Bad breath. How is this diagnosed? This condition is diagnosed based on:  Your symptoms.  Your medical history.  A physical exam.  Tests to find out if your condition is short-term (acute) or long-term (chronic). Your doctor may: ? Check your nose for growths (polyps). ? Check your sinuses using a tool that has a light (endoscope). ? Check for allergies or germs. ? Do imaging tests, such as an MRI or CT scan. How is this treated? Treatment for this condition depends on the cause and whether it is short-term or long-term.  If caused by a virus, your symptoms should go away on their own within 10 days. You may be given medicines to relieve symptoms. They include: ? Medicines that shrink swollen tissue in the nose. ? Medicines that treat allergies (antihistamines). ? A spray that treats swelling of the nostrils. ? Rinses that help get rid of thick mucus in your nose (nasal saline washes).  If caused by bacteria, your doctor may wait to see if you will get better without treatment. You may be given antibiotic medicine if you have: ? A very bad infection. ? A weak body defense system.  If caused by growths in the nose, you may need to have surgery. Follow these instructions at home: Medicines  Take, use, or apply over-the-counter and prescription medicines only as told by your doctor. These may include nasal sprays.  If you were prescribed an antibiotic medicine, take  it as told by your doctor. Do not stop taking the antibiotic even if you start to feel better. Hydrate and humidify   Drink enough water to keep your pee (urine) pale yellow.  Use a cool mist humidifier to keep the humidity level in your home above 50%.  Breathe in steam for 10-15 minutes, 3-4 times a day, or as told by your doctor. You can do this in the bathroom while a hot shower is running.  Try not to spend time in cool or dry air. Rest  Rest as much as you can.  Sleep with your head raised (elevated).  Make sure you get enough sleep each night. General instructions   Put a warm, moist washcloth on your face 3-4 times a day, or as often as told by your doctor. This will help with discomfort.  Wash your hands often with soap and water. If there is no soap and water, use hand sanitizer.  Do not smoke. Avoid being around people who are smoking (secondhand smoke).  Keep all follow-up visits as told by your doctor. This is important. Contact a doctor if:  You have a fever.  Your symptoms get worse.  Your symptoms do not get better within 10 days. Get help right away if:  You have a very bad headache.  You cannot stop throwing up (vomiting).  You have very bad pain or swelling around your face or eyes.  You have trouble seeing.  You feel confused.  Your neck is stiff.  You have trouble breathing. Summary  Sinusitis is swelling of your sinuses. Sinuses are hollow spaces in the bones around your face.  This condition is caused by tissues in your nose that become inflamed or swollen. This traps germs. These can lead to infection.  If you were prescribed an antibiotic medicine, take it as told by your doctor. Do not stop taking it even if you start to feel better.  Keep all follow-up visits as told by your doctor. This is important. This information is not intended to replace advice given to you by your health care provider. Make sure you discuss any questions  you have with your health care provider. Document Released: 04/14/2008 Document Revised: 03/29/2018 Document Reviewed: 03/29/2018 Elsevier Interactive Patient Education  2019 [employer].

## 2018-11-30 NOTE — Progress Notes (Signed)
MRN: [id number] DOB: 09-May-1961  Subjective:   Christina [name] is a 58 y.o. female presenting for chief complaint of Fever (X 10 DAYS); Chills; Cough (X 10 DAYS); Hoarse (X 10 DAYS); Sinus Problem (X 10 DAYS); Headache (X 10 DAYS); Wheezing (X 10 DAYS ); Ear Pain (X 1 DAY ); and Eye Problem (X 1 DAY, RED, CRUSTED ) .  Reports 10-day history of worsening illness.  Started out with sore throat and sinus congestion.  Since then, sinus pressure has worsened.  Has associated subjective fever, nasal congestion, decreased smelling, ear fullness, postnasal drip, dry hacking cough, intermittent wheezing which is worse at night, and itchy watery eyes.  Had some crusty eye discharge when she woke up this morning. Did not you visit on 11/26/2018 and treated for viral URI.  She has been taking oral antihistamine, Tessalon Perles, DayQuil, NyQuil with no relief.  Denies inability to swallow, productive cough, shortness of breath, chest pain and myalgia, nausea, vomiting, abdominal pain and diarrhea. Denies photophobia, visual disturbance, and foreign body sensation. Does not wear contact lens.  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  Former smoker, quit in 2018.  Denies any other aggravating or relieving factors, no other questions or concerns.  Review of Systems  Constitutional: Negative for diaphoresis.  Respiratory: Negative for hemoptysis.   Cardiovascular: Negative for palpitations.  Neurological: Negative for dizziness, weakness and headaches.    Christina [name] has a current medication list which includes the following prescription(s): pseudoephedrine-apap-dm, azelastine, benzonatate, benzonatate, fluticasone, lisinopril, loratadine, metformin, prednisone, and vitamin d (ergocalciferol). Also is allergic to influenza  vaccines; ibuprofen; and egg white [albumen, egg].  Christina [name]  has a past medical history of Allergy, Arthritis, Asthma, DDD (degenerative disc disease), lumbar, Diabetes mellitus without complication ([place]), GERD (gastroesophageal reflux disease), H/O seasonal allergies, Perforated ulcer ([place]), and Ulcer. Also  has a past surgical history that includes Bunionectomy (Right); Repair of perforated ulcer (1997); Colonoscopy; umbicial hernia; Breast lumpectomy with radioactive seed localization (Right, 01/15/2017); and Cholecystectomy (N/A, 01/15/2017).   Objective:   Vitals: BP 135/88 (BP Location: Right Arm, Patient Position: Sitting, Cuff Size: Normal)   Pulse 87   Temp 98.6 F (37 C) (Oral)   Resp 18   Wt 210 lb 6.4 oz (95.4 kg)   SpO2 98%   BMI 34.48 kg/m   Physical Exam Vitals signs reviewed.  Constitutional:      General: She is not in acute distress.    Appearance: She is well-developed. She is not toxic-appearing.  HENT:     Head: Normocephalic and atraumatic.     Right Ear: Ear canal and external ear normal. A middle ear effusion is present. Tympanic membrane is not erythematous or bulging.     Left Ear: Ear canal and external ear normal. A middle ear effusion is present. Tympanic membrane is not erythematous or bulging.     Nose: Mucosal edema and congestion present.     Right Sinus: Maxillary sinus tenderness present. No frontal sinus tenderness.     Left Sinus: Maxillary sinus tenderness present. No frontal sinus tenderness.     Mouth/Throat:     Lips: Pink.     Mouth: Mucous membranes are moist.     Pharynx: Uvula midline. Posterior oropharyngeal erythema present. No oropharyngeal exudate or uvula swelling.     Tonsils: No tonsillar exudate or tonsillar abscesses.  Swelling: 0 on the right. 0 on the left.     Comments: S/p tonsillectomy  Eyes:     General: Lids are normal.        Right eye: Discharge (watery) present.        Left eye: Discharge (watery) present.     Extraocular Movements: Extraocular movements intact.     Conjunctiva/sclera:     Right eye: Right conjunctiva is injected (mild).     Left eye: Left conjunctiva is injected (mild).     Pupils: Pupils are equal, round, and reactive to light.  Neck:     Musculoskeletal: Normal range of motion.  Cardiovascular:     Rate and Rhythm: Normal rate and regular rhythm.     Heart sounds: Normal heart sounds.  Pulmonary:     Effort: Pulmonary effort is normal. No tachypnea, accessory muscle usage or respiratory distress.     Breath sounds: Wheezing (few wheezes noted with forced expiratory breathing) present. No decreased breath sounds, rhonchi or rales.  Lymphadenopathy:     Head:     Right side of head: No submental, submandibular, tonsillar, preauricular, posterior auricular or occipital adenopathy.     Left side of head: No submental, submandibular, tonsillar, preauricular, posterior auricular or occipital adenopathy.     Cervical: No cervical adenopathy.     Upper Body:     Right upper body: No supraclavicular adenopathy.     Left upper body: No supraclavicular adenopathy.  Skin:    General: Skin is warm and dry.  Neurological:     Mental Status: She is alert.     Cranial Nerves: Cranial nerves are intact.     No results found for this or any previous visit (from the past 24 hour(s)).  Assessment and Plan :  1. Acute maxillary sinusitis, recurrence not specified Patient is overall well appearing, NAD. VSS. No acute findings on neuro exam. Due to duration, symptoms, and no improvement with conservative management, will treat with oral antibiotics at this time. Recommend continuing symptomatic treatment as well.  Due to degree nasal congestion and few wheezes on forced expiratory wheezing, will also provide patient with prescription of oral prednisone.  Given symptomatic treatment for cough and refill for albuterol inhaler to use as needed for wheezing.  Advised to f/u with family doctor if  no improvement with treatment plan or to seek care sooner if symptoms worsen/develops new concerning symptoms. Patient voices understanding.  - amoxicillin-clavulanate (AUGMENTIN) 875-125 MG tablet; Take 1 tablet by mouth 2 (two) times daily for 7 days.  Dispense: 14 tablet; Refill: 0 - fluticasone (FLONASE) 50 MCG/ACT nasal spray; Place 2 sprays into both nostrils daily.  Dispense: 16 g; Refill: 0  2. Cough - predniSONE (DELTASONE) 20 MG tablet; Take 2 tablets (40 mg total) by mouth daily with breakfast for 5 days.  Dispense: 10 tablet; Refill: 0 - promethazine-dextromethorphan (PROMETHAZINE-DM) 6.25-15 MG/5ML syrup; Take 5 mLs by mouth 4 (four) times daily as needed for cough.  Dispense: 118 mL; Refill: 0 - albuterol (PROVENTIL HFA;VENTOLIN HFA) 108 (90 Base) MCG/ACT inhaler; Inhale 2 puffs into the lungs every 4 (four) hours as needed for wheezing or shortness of breath (cough, shortness of breath or wheezing.).  Dispense: 1 Inhaler; Refill: 0  3. Viral conjunctivitis of both eyes History of PE findings consistent with viral conjunctivitis.  No concern for bacterial etiology at this time.  No purulent drainage.  Recommend symptomatic treatment at this time.  Follow-up with family doctor if no improvement  with current treatment plan or seek care sooner if symptoms worsen/develop new concerning symptoms. - naphazoline-pheniramine (NAPHCON-A) 0.025-0.3 % ophthalmic solution; Place 1 drop into both eyes 4 (four) times daily as needed for eye irritation.  Dispense: 15 mL; Refill: 0    Tenna Delaine, [facility] 11/30/2018 4:54 PM

## 2018-12-01 ENCOUNTER — Ambulatory Visit: Payer: BLUE CROSS/BLUE SHIELD | Admitting: Family Medicine

## 2019-01-11 ENCOUNTER — Telehealth: Payer: Self-pay | Admitting: Family Medicine

## 2019-01-11 ENCOUNTER — Other Ambulatory Visit: Payer: Self-pay | Admitting: Family Medicine

## 2019-01-11 DIAGNOSIS — J0100 Acute maxillary sinusitis, unspecified: Secondary | ICD-10-CM

## 2019-01-11 NOTE — Telephone Encounter (Signed)
Pt needs refill on flonase, lisinopril, vit d sent to [facility].

## 2019-01-12 MED ORDER — VITAMIN D (ERGOCALCIFEROL) 1.25 MG (50000 UNIT) PO CAPS: 50000.0000 [IU] | ORAL_CAPSULE | ORAL | 1 refills | Status: AC

## 2019-01-12 MED ORDER — FLUTICASONE PROPIONATE 50 MCG/ACT NA SUSP: NASAL | 5 refills | Status: AC

## 2019-01-12 MED ORDER — LISINOPRIL 20 MG PO TABS: 20.0000 mg | ORAL_TABLET | Freq: Every day | ORAL | 3 refills | Status: AC

## 2019-01-12 NOTE — Telephone Encounter (Signed)
meds sent to pharm

## 2019-02-13 IMAGING — CR DG KNEE COMPLETE 4+V*R*
4 series · 4 of 4 positions shown · non-contrast
Comparison: CT of the abdomen and pelvis on 05/11/2014

CLINICAL DATA: Medial right knee pain, no injury; Low back and
right leg pain, no injury

EXAM:
RIGHT KNEE - COMPLETE 4+ VIEW; LUMBAR SPINE - COMPLETE 4+ VIEW

[t knee ap right]
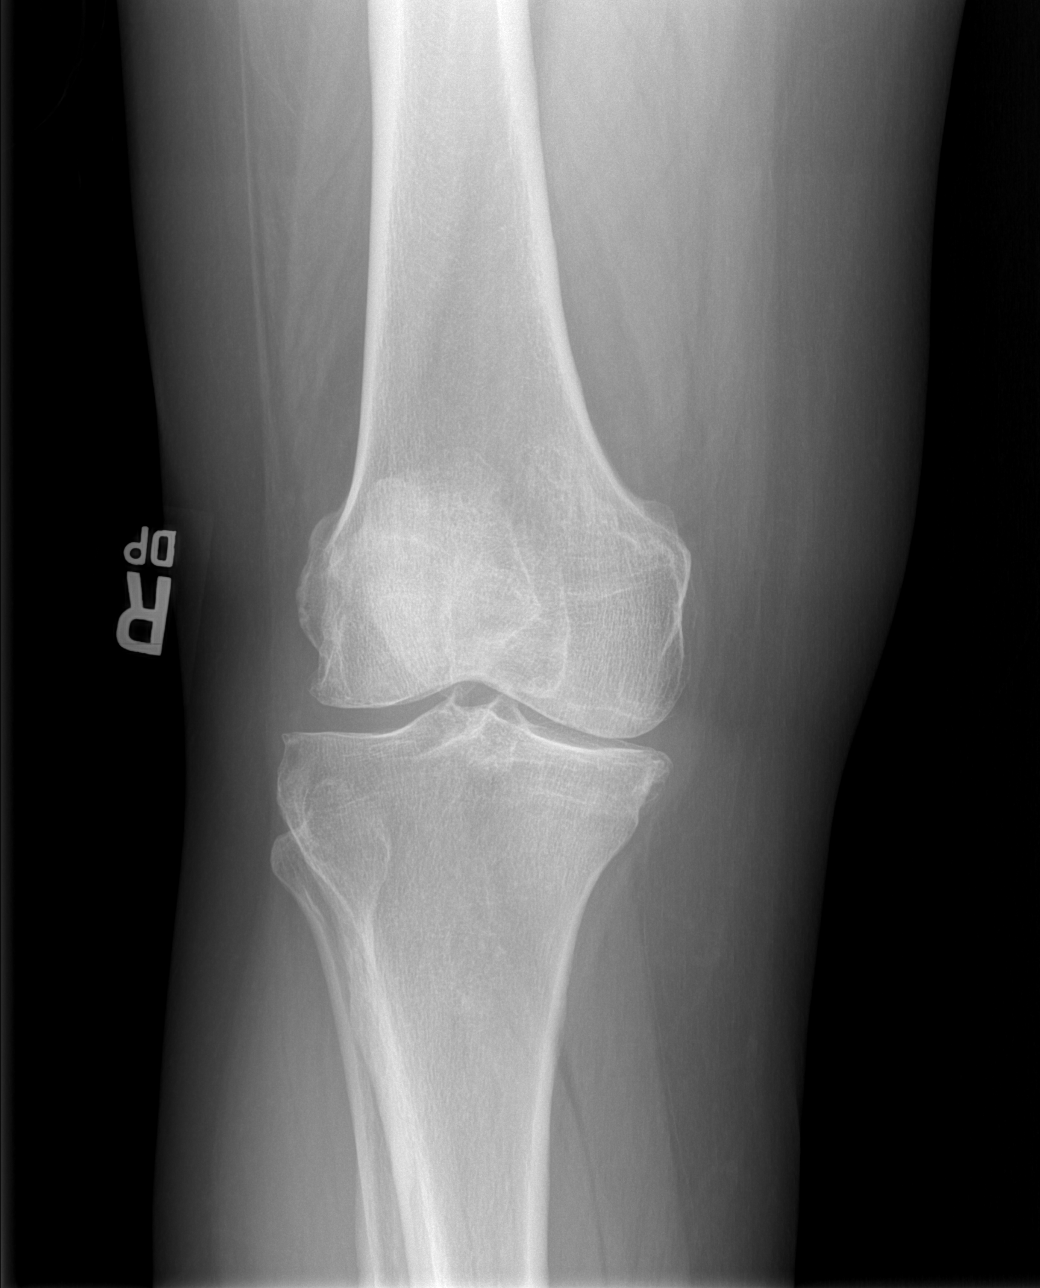

[t knee oblique right (1 of 2)]
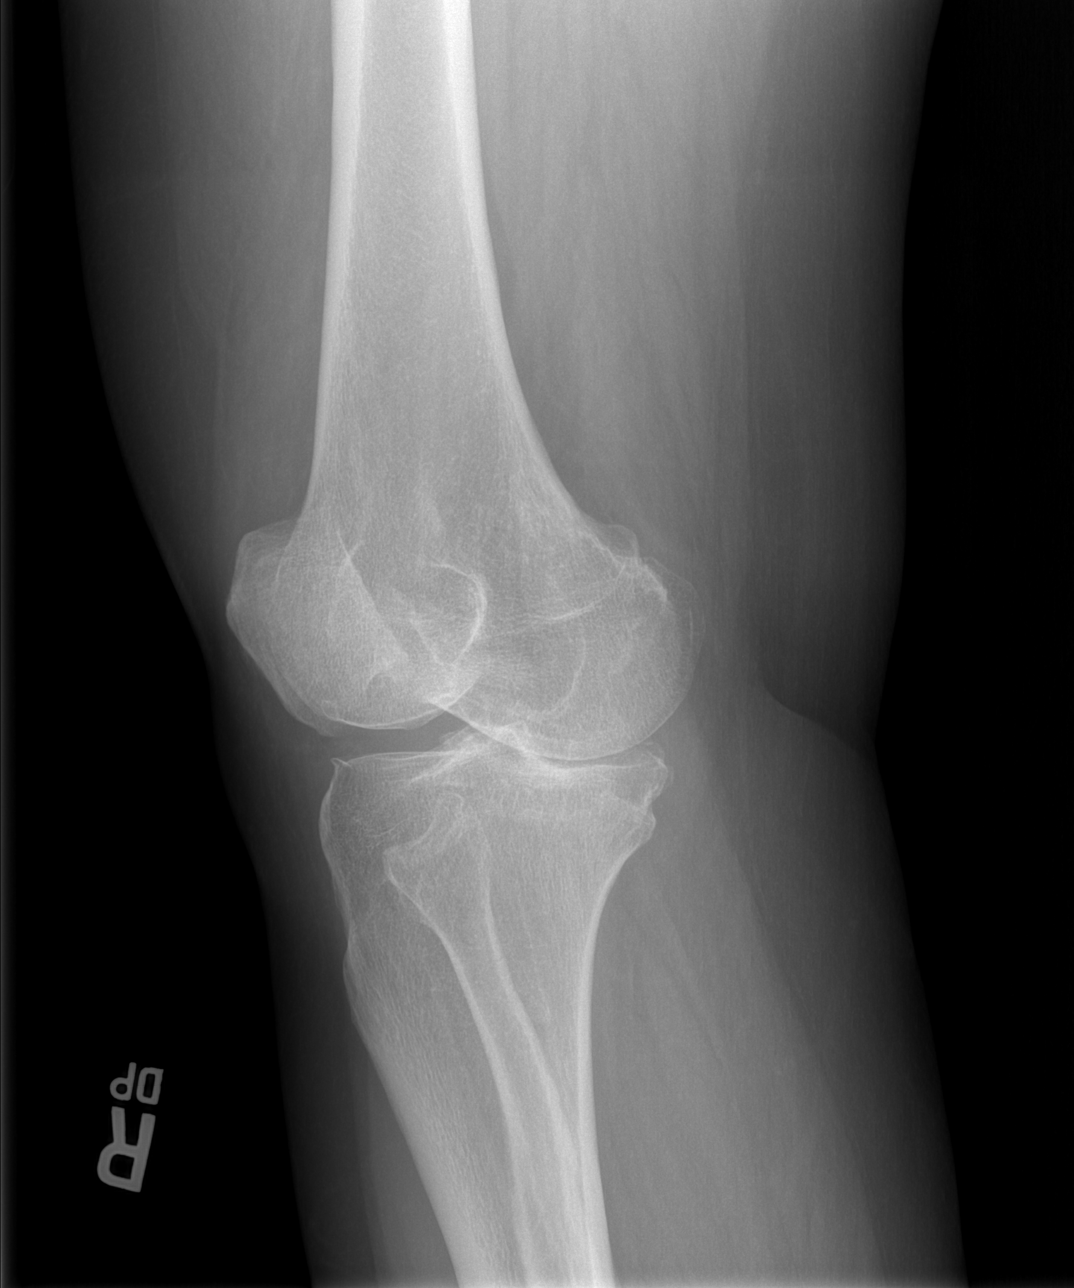

[t knee oblique right (2 of 2)]
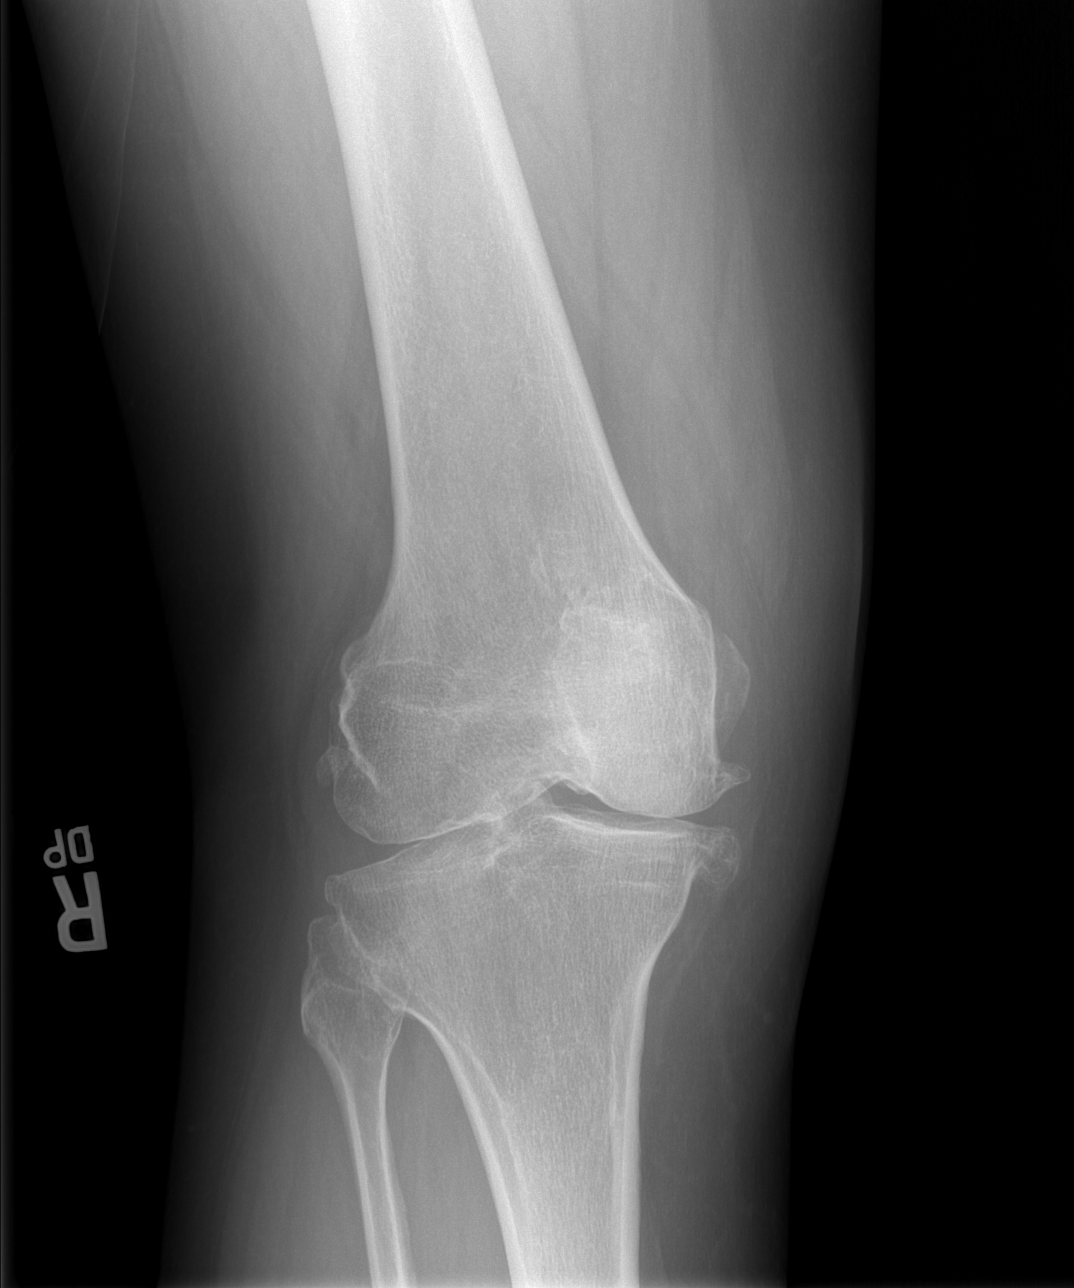

[t knee lat right]
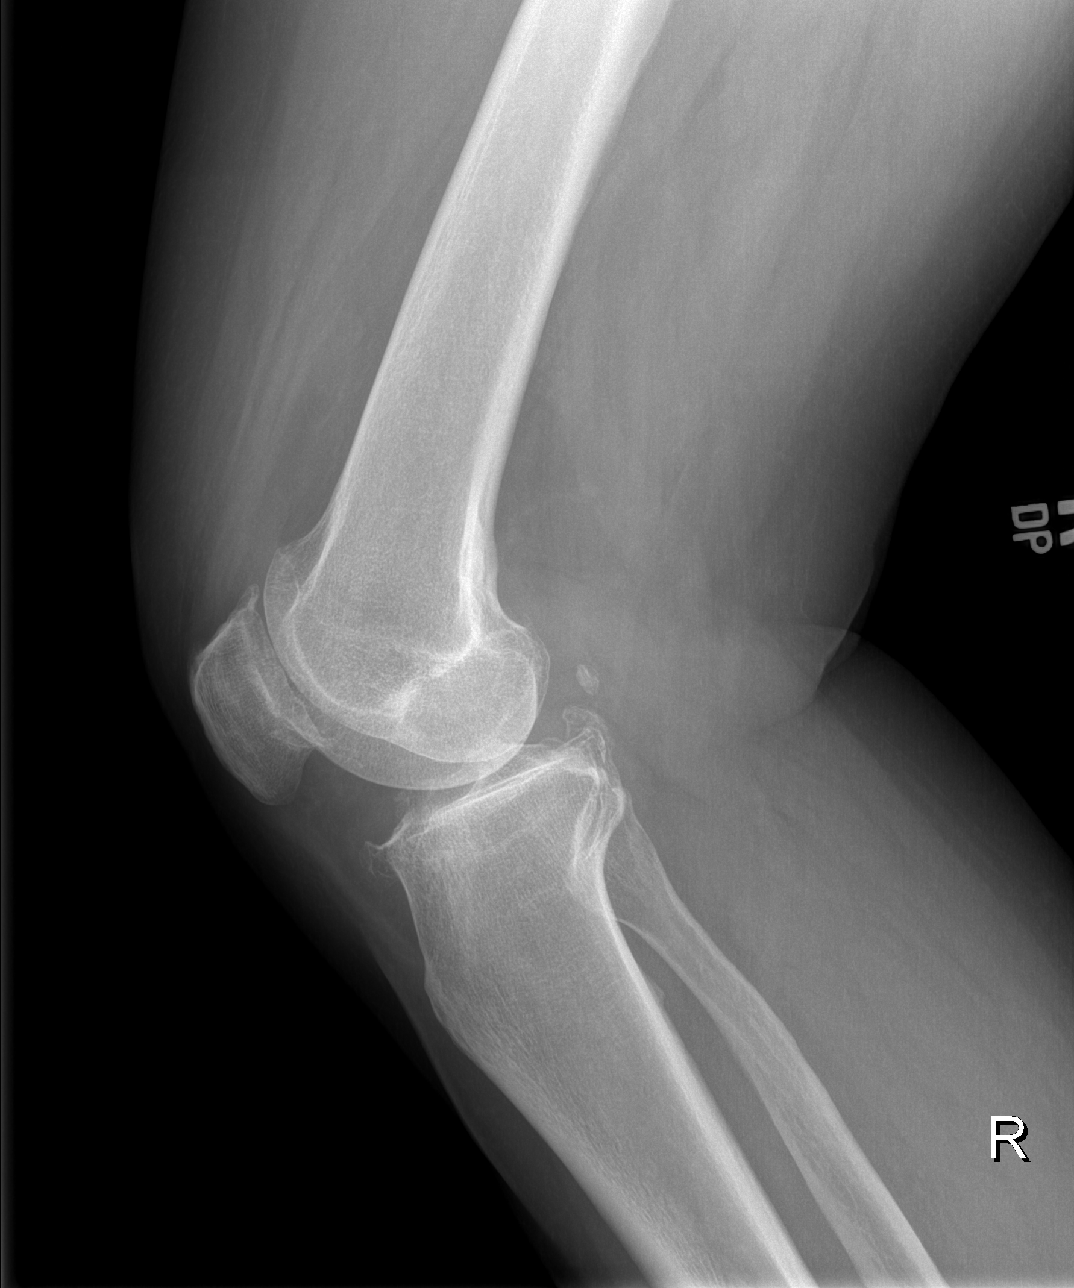

[4 of 4 positions shown; findings below may reference images not displayed]

FINDINGS: There is disc height loss and facet hypertrophy primarily at L5-S1.
No acute fracture or subluxation. No suspicious lytic or blastic
lesions are identified.

Surgical clips are noted in the right upper quadrant the abdomen.
Bowel gas pattern is nonobstructive.
IMPRESSION: Degenerative changes primarily at L5-S1. No evidence for acute
abnormality.

## 2019-02-13 IMAGING — CR DG LUMBAR SPINE COMPLETE 4+V
5 series · 5 of 5 positions shown · non-contrast
Comparison: CT of the abdomen and pelvis on 05/11/2014

CLINICAL DATA: Medial right knee pain, no injury; Low back and
right leg pain, no injury

EXAM:
RIGHT KNEE - COMPLETE 4+ VIEW; LUMBAR SPINE - COMPLETE 4+ VIEW

[t l-spine a.p.]
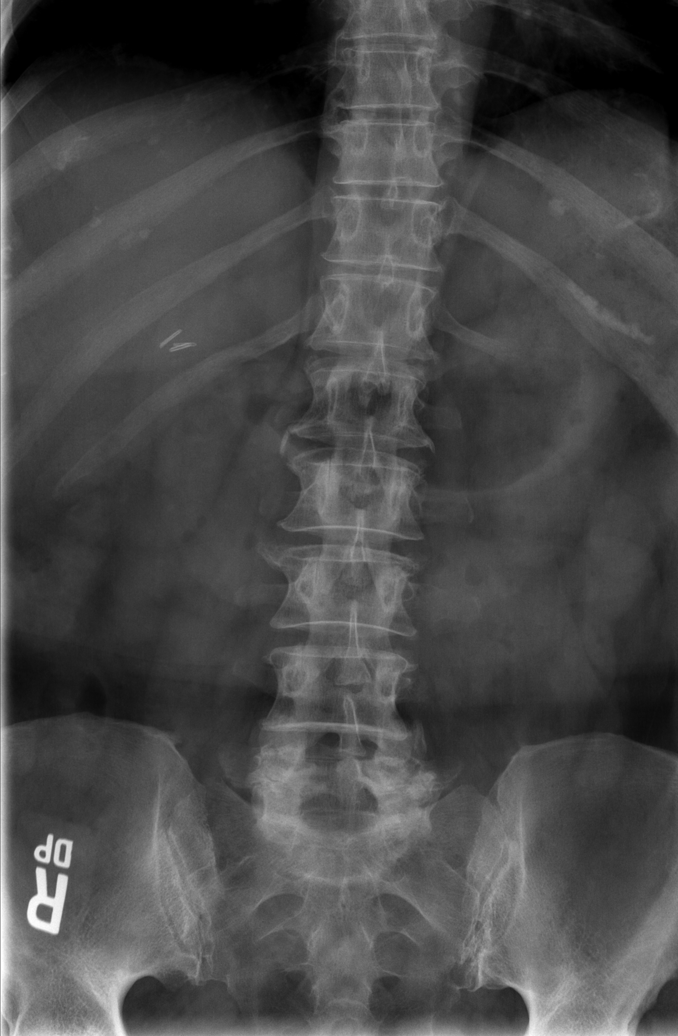

[t l-spine oblique exposure (1 of 2)]
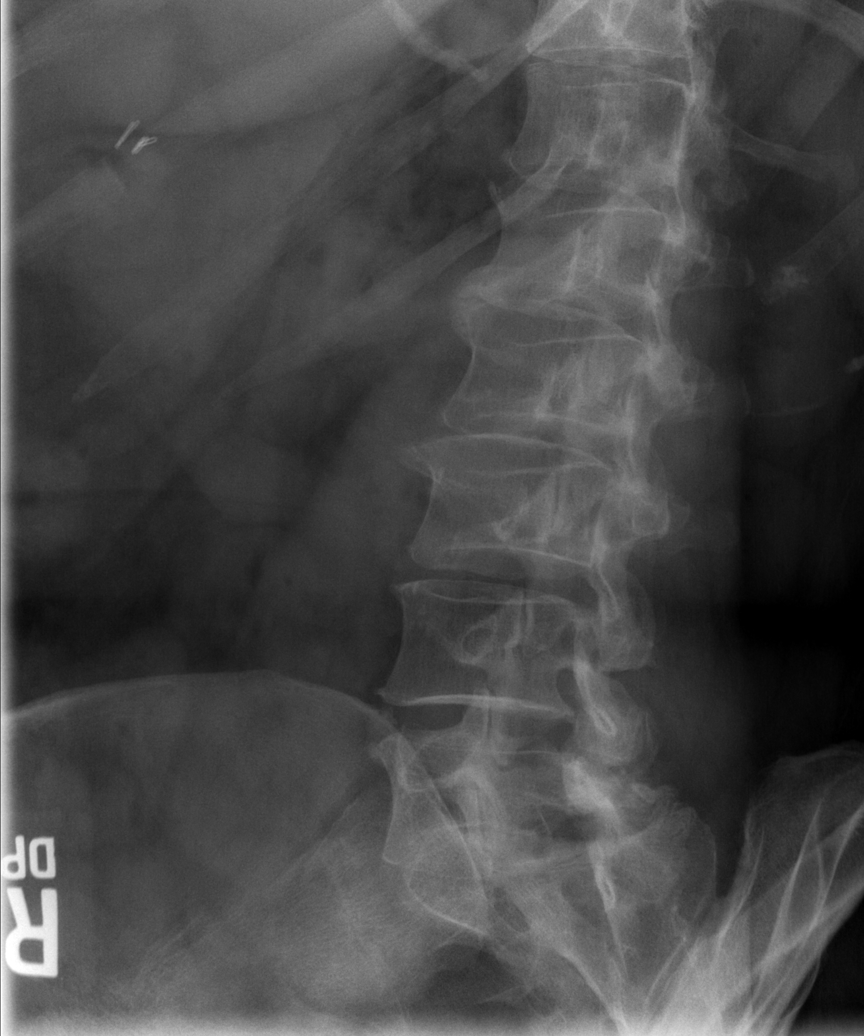

[t l-spine lat]
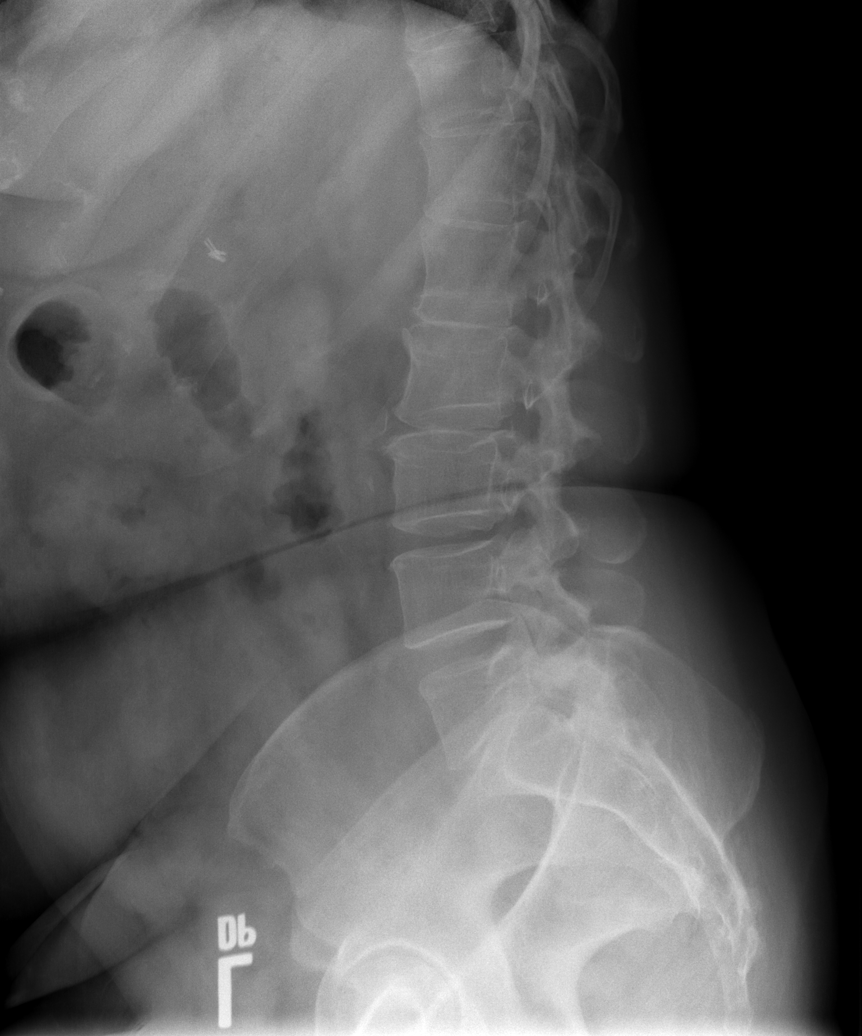

[t l-spine l5-s1 spot]
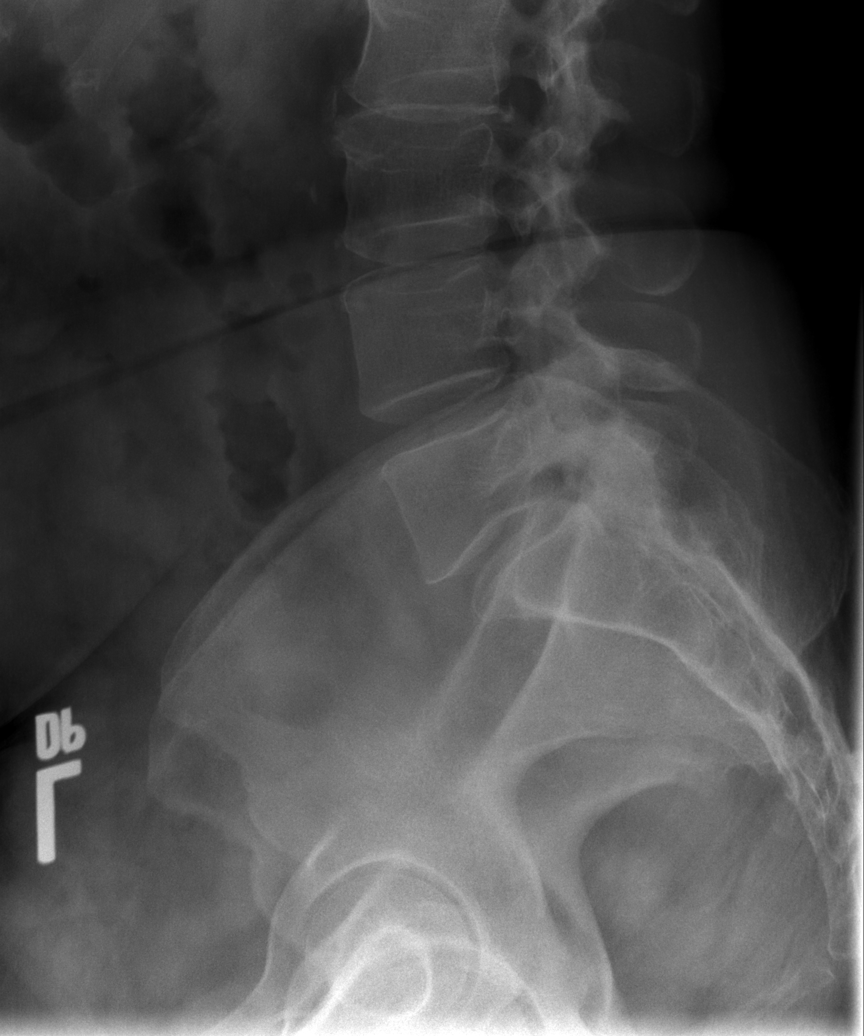

[t l-spine oblique exposure (2 of 2)]
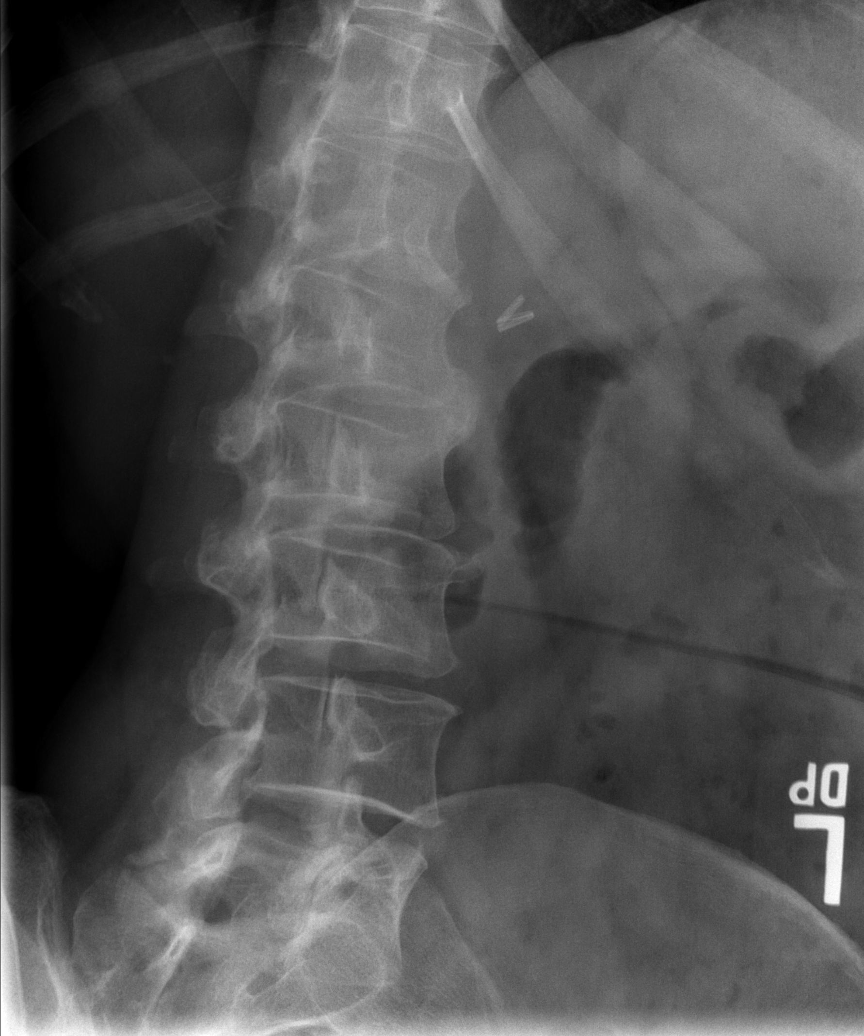

[5 of 5 positions shown; findings below may reference images not displayed]

FINDINGS: There is disc height loss and facet hypertrophy primarily at L5-S1.
No acute fracture or subluxation. No suspicious lytic or blastic
lesions are identified.

Surgical clips are noted in the right upper quadrant the abdomen.
Bowel gas pattern is nonobstructive.
IMPRESSION: Degenerative changes primarily at L5-S1. No evidence for acute
abnormality.

## 2022-12-22 ENCOUNTER — Other Ambulatory Visit: Payer: Self-pay | Admitting: Family Medicine

## 2022-12-22 DIAGNOSIS — Z1231 Encounter for screening mammogram for malignant neoplasm of breast: Secondary | ICD-10-CM
